# Patient Record
Sex: Female | Born: 1965 | Race: Black or African American | Hispanic: No | Smoking: Former smoker
Health system: Southern US, Community
[De-identification: ages and names within clinical notes are randomized; demographics above are authoritative.]

## PROBLEM LIST (undated history)

## (undated) DIAGNOSIS — F329 Major depressive disorder, single episode, unspecified: Secondary | ICD-10-CM

## (undated) DIAGNOSIS — E559 Vitamin D deficiency, unspecified: Secondary | ICD-10-CM

## (undated) DIAGNOSIS — Z9889 Other specified postprocedural states: Secondary | ICD-10-CM

## (undated) DIAGNOSIS — G43909 Migraine, unspecified, not intractable, without status migrainosus: Secondary | ICD-10-CM

## (undated) DIAGNOSIS — R87810 Cervical high risk human papillomavirus (HPV) DNA test positive: Secondary | ICD-10-CM

## (undated) DIAGNOSIS — F32A Depression, unspecified: Secondary | ICD-10-CM

## (undated) DIAGNOSIS — Z6826 Body mass index (BMI) 26.0-26.9, adult: Secondary | ICD-10-CM

## (undated) DIAGNOSIS — N943 Premenstrual tension syndrome: Secondary | ICD-10-CM

## (undated) DIAGNOSIS — K219 Gastro-esophageal reflux disease without esophagitis: Secondary | ICD-10-CM

## (undated) DIAGNOSIS — R232 Flushing: Secondary | ICD-10-CM

## (undated) DIAGNOSIS — D259 Leiomyoma of uterus, unspecified: Secondary | ICD-10-CM

## (undated) DIAGNOSIS — E785 Hyperlipidemia, unspecified: Secondary | ICD-10-CM

## (undated) DIAGNOSIS — T753XXA Motion sickness, initial encounter: Secondary | ICD-10-CM

## (undated) DIAGNOSIS — N950 Postmenopausal bleeding: Secondary | ICD-10-CM

## (undated) HISTORY — DX: Postmenopausal bleeding: N95.0

## (undated) HISTORY — DX: Depression, unspecified: F32.A

## (undated) HISTORY — DX: Motion sickness, initial encounter: T75.3XXA

## (undated) HISTORY — DX: Other specified postprocedural states: Z98.890

## (undated) HISTORY — PX: UTERINE ARTERY EMBOLIZATION: SHX2629

## (undated) HISTORY — DX: Leiomyoma of uterus, unspecified: D25.9

## (undated) HISTORY — DX: Flushing: R23.2

## (undated) HISTORY — DX: Hyperlipidemia, unspecified: E78.5

## (undated) HISTORY — PX: TUBAL LIGATION: SHX77

## (undated) HISTORY — DX: Premenstrual tension syndrome: N94.3

## (undated) HISTORY — DX: Cervical high risk human papillomavirus (HPV) DNA test positive: R87.810

## (undated) HISTORY — DX: Body mass index (BMI) 26.0-26.9, adult: Z68.26

## (undated) HISTORY — DX: Major depressive disorder, single episode, unspecified: F32.9

## (undated) HISTORY — DX: Gastro-esophageal reflux disease without esophagitis: K21.9

## (undated) HISTORY — DX: Vitamin D deficiency, unspecified: E55.9

## (undated) HISTORY — DX: Migraine, unspecified, not intractable, without status migrainosus: G43.909

## (undated) HISTORY — PX: DILATION AND EVACUATION: SHX1459

---

## 1999-06-30 ENCOUNTER — Other Ambulatory Visit: Admission: RE | Admit: 1999-06-30 | Discharge: 1999-06-30 | Payer: Self-pay | Admitting: Gynecology

## 1999-10-22 ENCOUNTER — Other Ambulatory Visit: Admission: RE | Admit: 1999-10-22 | Discharge: 1999-10-22 | Payer: Self-pay | Admitting: Gynecology

## 2000-11-22 ENCOUNTER — Other Ambulatory Visit: Admission: RE | Admit: 2000-11-22 | Discharge: 2000-11-22 | Payer: Self-pay | Admitting: Gynecology

## 2003-01-16 ENCOUNTER — Emergency Department (HOSPITAL_COMMUNITY): Admission: EM | Admit: 2003-01-16 | Discharge: 2003-01-16 | Payer: Self-pay | Admitting: Emergency Medicine

## 2006-03-09 ENCOUNTER — Encounter: Admission: RE | Admit: 2006-03-09 | Discharge: 2006-03-09 | Payer: Self-pay | Admitting: Obstetrics and Gynecology

## 2006-03-10 ENCOUNTER — Encounter: Admission: RE | Admit: 2006-03-10 | Discharge: 2006-03-10 | Payer: Self-pay | Admitting: Interventional Radiology

## 2006-04-03 ENCOUNTER — Ambulatory Visit (HOSPITAL_COMMUNITY): Admission: RE | Admit: 2006-04-03 | Discharge: 2006-04-03 | Payer: Self-pay | Admitting: Interventional Radiology

## 2006-04-05 ENCOUNTER — Observation Stay (HOSPITAL_COMMUNITY): Admission: RE | Admit: 2006-04-05 | Discharge: 2006-04-06 | Payer: Self-pay | Admitting: Interventional Radiology

## 2006-06-30 ENCOUNTER — Encounter (INDEPENDENT_AMBULATORY_CARE_PROVIDER_SITE_OTHER): Payer: Self-pay | Admitting: Specialist

## 2006-06-30 ENCOUNTER — Ambulatory Visit (HOSPITAL_COMMUNITY): Admission: RE | Admit: 2006-06-30 | Discharge: 2006-06-30 | Payer: Self-pay | Admitting: Gastroenterology

## 2007-04-10 ENCOUNTER — Encounter: Admission: RE | Admit: 2007-04-10 | Discharge: 2007-04-10 | Payer: Self-pay | Admitting: Obstetrics and Gynecology

## 2008-04-11 ENCOUNTER — Encounter: Admission: RE | Admit: 2008-04-11 | Discharge: 2008-04-11 | Payer: Self-pay | Admitting: Obstetrics and Gynecology

## 2009-04-13 ENCOUNTER — Encounter: Admission: RE | Admit: 2009-04-13 | Discharge: 2009-04-13 | Payer: Self-pay | Admitting: Obstetrics and Gynecology

## 2010-04-14 ENCOUNTER — Encounter: Admission: RE | Admit: 2010-04-14 | Discharge: 2010-04-14 | Payer: Self-pay | Admitting: Obstetrics and Gynecology

## 2010-09-26 ENCOUNTER — Encounter: Payer: Self-pay | Admitting: Gastroenterology

## 2011-01-21 NOTE — Op Note (Signed)
NAME:  Sharon Mccall, Sharon Mccall               ACCOUNT NO.:  [id number]   MEDICAL RECORD NO.:  [id number]          PATIENT TYPE:  AMB   LOCATION:  ENDO                         FACILITY:  [facility]   PHYSICIAN:  Anselmo Rod, M.D.  DATE OF BIRTH:  Dec 19, 1965   DATE OF PROCEDURE:  06/30/2006  DATE OF DISCHARGE:  06/30/2006                                 OPERATIVE REPORT   PROCEDURE PERFORMED:  Esophagogastroduodenoscopy with gastric biopsies.   ENDOSCOPIST:  Anselmo Rod, M.D.   INSTRUMENT USED:  Olympus video panendoscope.   INDICATIONS FOR PROCEDURE:  The patient is a 45 year old female undergoing  esophagogastroduodenoscopy.  The patient history or reflux and epigastric  pain.  Rule out peptic ulcer disease, esophagitis, gastritis, etc.   PREPROCEDURE PREPARATION:  Informed consent was procured from the patient.  The patient was fasted for four hours prior to the procedure.  The risks and  benefits of the procedure were discussed with her in great detail.   PREPROCEDURE PHYSICAL:  The patient had stable vital signs.  Neck supple,  chest clear to auscultation.  S1, S2 regular.  Abdomen soft with normal  bowel sounds.   DESCRIPTION OF PROCEDURE:  The patient was placed in the left lateral  decubitus position and sedated with 50 mcg of fentanyl and 5 mg of Versed in  slow incremental doses.  Once the patient was adequately sedated and  maintained on low-flow oxygen and continuous cardiac monitoring, the Olympus  video panendoscope was advanced through the mouth piece over the tongue into  the esophagus under direct vision.  The entire esophagus appeared normal  with no evidence of ring, stricture, masses, esophagitis or Barrett's  mucosa.  The scope was then advanced to the stomach.  Edematous gastric  folds were noted.  Biopsies were done to rule out presence of Helicobacter  pylori by pathology.  No masses or polyps were identified.  Retroflexion in  the high cardia revealed small hiatal  hernia.  The proximal small bowel  appeared normal. There was no outlet obstruction.  The patient tolerated the  procedure well without complications.   IMPRESSION:  1. Normal-appearing esophagus.  2. Edematous gastric fold, biopsy done for Helicobacter pylori.  3. Small hiatal hernia seen on high retroflexion.  4. Normal proximal small bowel.   RECOMMENDATIONS:  1. Await pathology results.  2. Avoid all nonsteroidals including aspirin for now.  3. Proceed with a CT scan of the abdomen and pelvis for further evaluation      of abdominal pain.  4. Outpatient followup in the next two weeks for further recommendations.      Anselmo Rod, M.D.  Electronically Signed     JNM/MEDQ  D:  07/02/2006  T:  07/03/2006  Job:  [id number]   cc:   Maxie Better, M.D.

## 2011-03-14 ENCOUNTER — Other Ambulatory Visit: Payer: Self-pay | Admitting: Obstetrics and Gynecology

## 2011-03-14 DIAGNOSIS — Z1231 Encounter for screening mammogram for malignant neoplasm of breast: Secondary | ICD-10-CM

## 2011-04-18 ENCOUNTER — Ambulatory Visit
Admission: RE | Admit: 2011-04-18 | Discharge: 2011-04-18 | Disposition: A | Discharge status: Home / Self Care | Payer: BC Managed Care – PPO | Source: Ambulatory Visit | Attending: Obstetrics and Gynecology | Admitting: Obstetrics and Gynecology

## 2011-04-18 DIAGNOSIS — Z1231 Encounter for screening mammogram for malignant neoplasm of breast: Secondary | ICD-10-CM

## 2012-03-02 ENCOUNTER — Other Ambulatory Visit: Payer: Self-pay | Admitting: Obstetrics and Gynecology

## 2012-03-02 DIAGNOSIS — Z1231 Encounter for screening mammogram for malignant neoplasm of breast: Secondary | ICD-10-CM

## 2012-04-18 ENCOUNTER — Ambulatory Visit
Admission: RE | Admit: 2012-04-18 | Discharge: 2012-04-18 | Disposition: A | Discharge status: Home / Self Care | Payer: BC Managed Care – PPO | Source: Ambulatory Visit | Attending: Obstetrics and Gynecology | Admitting: Obstetrics and Gynecology

## 2012-04-18 DIAGNOSIS — Z1231 Encounter for screening mammogram for malignant neoplasm of breast: Secondary | ICD-10-CM

## 2012-12-20 ENCOUNTER — Ambulatory Visit: Payer: BC Managed Care – PPO | Admitting: *Deleted

## 2013-03-14 ENCOUNTER — Other Ambulatory Visit: Payer: Self-pay

## 2013-03-14 DIAGNOSIS — Z1231 Encounter for screening mammogram for malignant neoplasm of breast: Secondary | ICD-10-CM

## 2013-04-19 ENCOUNTER — Ambulatory Visit
Admission: RE | Admit: 2013-04-19 | Discharge: 2013-04-19 | Disposition: A | Discharge status: Home / Self Care | Payer: BC Managed Care – PPO | Source: Ambulatory Visit

## 2013-04-19 DIAGNOSIS — Z1231 Encounter for screening mammogram for malignant neoplasm of breast: Secondary | ICD-10-CM

## 2014-03-12 ENCOUNTER — Other Ambulatory Visit: Payer: Self-pay

## 2014-03-12 DIAGNOSIS — Z1231 Encounter for screening mammogram for malignant neoplasm of breast: Secondary | ICD-10-CM

## 2014-04-21 ENCOUNTER — Encounter (INDEPENDENT_AMBULATORY_CARE_PROVIDER_SITE_OTHER): Payer: Self-pay

## 2014-04-21 ENCOUNTER — Ambulatory Visit
Admission: RE | Admit: 2014-04-21 | Discharge: 2014-04-21 | Disposition: A | Discharge status: Home / Self Care | Payer: BC Managed Care – PPO | Source: Ambulatory Visit

## 2014-04-21 DIAGNOSIS — Z1231 Encounter for screening mammogram for malignant neoplasm of breast: Secondary | ICD-10-CM

## 2015-03-17 ENCOUNTER — Other Ambulatory Visit: Payer: Self-pay

## 2015-03-17 DIAGNOSIS — Z9289 Personal history of other medical treatment: Secondary | ICD-10-CM

## 2015-04-27 ENCOUNTER — Ambulatory Visit
Admission: RE | Admit: 2015-04-27 | Discharge: 2015-04-27 | Disposition: A | Discharge status: Home / Self Care | Payer: BC Managed Care – PPO | Source: Ambulatory Visit

## 2015-04-27 DIAGNOSIS — Z9289 Personal history of other medical treatment: Secondary | ICD-10-CM

## 2015-07-08 ENCOUNTER — Encounter (HOSPITAL_COMMUNITY): Payer: Self-pay | Admitting: Emergency Medicine

## 2015-07-08 ENCOUNTER — Emergency Department (HOSPITAL_COMMUNITY)
Admission: EM | Admit: 2015-07-08 | Discharge: 2015-07-08 | Disposition: A | Discharge status: Home / Self Care | Payer: BC Managed Care – PPO | Attending: Emergency Medicine | Admitting: Emergency Medicine

## 2015-07-08 DIAGNOSIS — Y92410 Unspecified street and highway as the place of occurrence of the external cause: Secondary | ICD-10-CM | POA: Diagnosis not present

## 2015-07-08 DIAGNOSIS — M79605 Pain in left leg: Secondary | ICD-10-CM

## 2015-07-08 DIAGNOSIS — Y9389 Activity, other specified: Secondary | ICD-10-CM | POA: Diagnosis not present

## 2015-07-08 DIAGNOSIS — S199XXA Unspecified injury of neck, initial encounter: Secondary | ICD-10-CM | POA: Insufficient documentation

## 2015-07-08 DIAGNOSIS — S8992XA Unspecified injury of left lower leg, initial encounter: Secondary | ICD-10-CM | POA: Diagnosis present

## 2015-07-08 DIAGNOSIS — Y998 Other external cause status: Secondary | ICD-10-CM | POA: Insufficient documentation

## 2015-07-08 DIAGNOSIS — S46812A Strain of other muscles, fascia and tendons at shoulder and upper arm level, left arm, initial encounter: Secondary | ICD-10-CM | POA: Diagnosis not present

## 2015-07-08 MED ORDER — CYCLOBENZAPRINE HCL 10 MG PO TABS: 10.0000 mg | ORAL_TABLET | Freq: Two times a day (BID) | ORAL | Status: DC | PRN

## 2015-07-08 MED ORDER — IBUPROFEN 600 MG PO TABS: 600.0000 mg | ORAL_TABLET | Freq: Four times a day (QID) | ORAL | Status: DC | PRN
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
## 2015-07-08 NOTE — Discharge Instructions (Signed)
Please read attached information. If you experience any new or worsening signs or symptoms please return to the emergency room for evaluation. Please follow-up with your primary care provider or specialist as discussed. Please use medication prescribed only as directed and discontinue taking if you have any concerning signs or symptoms.   °

## 2016-03-02 ENCOUNTER — Other Ambulatory Visit: Payer: Self-pay | Admitting: Obstetrics and Gynecology

## 2016-03-02 DIAGNOSIS — Z1231 Encounter for screening mammogram for malignant neoplasm of breast: Secondary | ICD-10-CM

## 2016-04-27 ENCOUNTER — Ambulatory Visit
Admission: RE | Admit: 2016-04-27 | Discharge: 2016-04-27 | Disposition: A | Discharge status: Home / Self Care | Payer: BC Managed Care – PPO | Source: Ambulatory Visit | Attending: Obstetrics and Gynecology | Admitting: Obstetrics and Gynecology

## 2016-04-27 DIAGNOSIS — Z1231 Encounter for screening mammogram for malignant neoplasm of breast: Secondary | ICD-10-CM

## 2016-10-01 ENCOUNTER — Emergency Department (HOSPITAL_COMMUNITY): Payer: BC Managed Care – PPO

## 2016-10-01 ENCOUNTER — Emergency Department (HOSPITAL_COMMUNITY)
Admission: EM | Admit: 2016-10-01 | Discharge: 2016-10-01 | Disposition: A | Discharge status: Home / Self Care | Payer: BC Managed Care – PPO | Attending: Emergency Medicine | Admitting: Emergency Medicine

## 2016-10-01 ENCOUNTER — Encounter (HOSPITAL_COMMUNITY): Payer: Self-pay | Admitting: Emergency Medicine

## 2016-10-01 DIAGNOSIS — Z79899 Other long term (current) drug therapy: Secondary | ICD-10-CM | POA: Insufficient documentation

## 2016-10-01 DIAGNOSIS — Y999 Unspecified external cause status: Secondary | ICD-10-CM | POA: Diagnosis not present

## 2016-10-01 DIAGNOSIS — R51 Headache: Secondary | ICD-10-CM | POA: Diagnosis not present

## 2016-10-01 DIAGNOSIS — M545 Low back pain: Secondary | ICD-10-CM | POA: Diagnosis present

## 2016-10-01 DIAGNOSIS — M25552 Pain in left hip: Secondary | ICD-10-CM | POA: Diagnosis not present

## 2016-10-01 DIAGNOSIS — Z87891 Personal history of nicotine dependence: Secondary | ICD-10-CM | POA: Diagnosis not present

## 2016-10-01 DIAGNOSIS — Y92410 Unspecified street and highway as the place of occurrence of the external cause: Secondary | ICD-10-CM | POA: Insufficient documentation

## 2016-10-01 DIAGNOSIS — Y939 Activity, unspecified: Secondary | ICD-10-CM | POA: Insufficient documentation

## 2016-10-01 LAB — I-STAT CHEM 8, ED
BUN: 14 mg/dL (ref 6–20)
Calcium, Ion: 1.18 mmol/L (ref 1.15–1.40)
Chloride: 104 mmol/L (ref 101–111)
Creatinine, Ser: 1.2 mg/dL — ABNORMAL HIGH (ref 0.44–1.00)
Glucose, Bld: 124 mg/dL — ABNORMAL HIGH (ref 65–99)
HCT: 39 % (ref 36.0–46.0)
Hemoglobin: 13.3 g/dL (ref 12.0–15.0)
Potassium: 3.7 mmol/L (ref 3.5–5.1)
Sodium: 142 mmol/L (ref 135–145)
TCO2: 29 mmol/L (ref 0–100)

## 2016-10-01 LAB — I-STAT BETA HCG BLOOD, ED (MC, WL, AP ONLY)
I-stat hCG, quantitative: 5.3 m[IU]/mL — ABNORMAL HIGH (ref <5)
I-stat hCG, quantitative: <5 m[IU]/mL (ref <5)

## 2016-10-01 MED ORDER — ONDANSETRON 4 MG PO TBDP: ORAL_TABLET | ORAL | 0 refills | Status: DC

## 2016-10-01 MED ORDER — ONDANSETRON HCL 4 MG/2ML IJ SOLN
4.0000 mg | Freq: Once | INTRAMUSCULAR | Status: AC
  Administered 2016-10-01: 4 mg via INTRAVENOUS
  Filled 2016-10-01: qty 2

## 2016-10-01 MED ORDER — SODIUM CHLORIDE 0.9 % IV BOLUS (SEPSIS)
1000.0000 mL | Freq: Once | INTRAVENOUS | Status: AC
  Administered 2016-10-01: 1000 mL via INTRAVENOUS

## 2016-10-01 MED ORDER — IOPAMIDOL (ISOVUE-300) INJECTION 61%
INTRAVENOUS | Status: AC
  Administered 2016-10-01: 100 mL via INTRAVENOUS
  Filled 2016-10-01: qty 100

## 2016-10-01 MED ORDER — ONDANSETRON 4 MG PO TBDP
4.0000 mg | ORAL_TABLET | Freq: Once | ORAL | Status: AC
  Administered 2016-10-01: 4 mg via ORAL
  Filled 2016-10-01: qty 1

## 2016-10-01 MED ORDER — HYDROMORPHONE HCL 1 MG/ML IJ SOLN
1.0000 mg | Freq: Once | INTRAMUSCULAR | Status: AC
  Administered 2016-10-01: 1 mg via INTRAVENOUS
  Filled 2016-10-01: qty 1

## 2016-10-01 MED ORDER — TRAMADOL HCL 50 MG PO TABS: 50.0000 mg | ORAL_TABLET | Freq: Four times a day (QID) | ORAL | 0 refills | Status: DC | PRN

## 2016-10-01 NOTE — ED Notes (Signed)
Pt returned from CT scan and assisted pt to restroom. Pt instructed on use of call bell in restroom to notify staff when done.

## 2016-10-01 NOTE — ED Notes (Signed)
Pt reports understanding of discharge information. No questions at time of discharge 
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
## 2016-10-01 NOTE — Discharge Instructions (Signed)
Rest at home 1-2 days. Drink plenty of fluids. Take medicines as needed for nausea and pain. Follow-up with your family doctor next week for recheck

## 2016-10-01 NOTE — ED Notes (Signed)
Pt in radiology at this time. 
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
## 2016-11-28 ENCOUNTER — Other Ambulatory Visit: Payer: Self-pay | Admitting: Internal Medicine

## 2016-11-28 ENCOUNTER — Ambulatory Visit
Admission: RE | Admit: 2016-11-28 | Discharge: 2016-11-28 | Disposition: A | Discharge status: Home / Self Care | Payer: BC Managed Care – PPO | Source: Ambulatory Visit | Attending: Internal Medicine | Admitting: Internal Medicine

## 2016-11-28 DIAGNOSIS — M19019 Primary osteoarthritis, unspecified shoulder: Secondary | ICD-10-CM

## 2017-01-12 ENCOUNTER — Encounter: Payer: Self-pay | Admitting: Internal Medicine

## 2017-02-03 ENCOUNTER — Encounter: Payer: Self-pay | Admitting: Internal Medicine

## 2017-02-03 ENCOUNTER — Telehealth: Payer: Self-pay | Admitting: Internal Medicine

## 2017-02-03 ENCOUNTER — Encounter (INDEPENDENT_AMBULATORY_CARE_PROVIDER_SITE_OTHER): Payer: Self-pay

## 2017-02-03 ENCOUNTER — Ambulatory Visit (INDEPENDENT_AMBULATORY_CARE_PROVIDER_SITE_OTHER): Payer: BC Managed Care – PPO | Admitting: Internal Medicine

## 2017-02-03 VITALS — BP 118/84 | HR 76 | Ht 62.0 in | Wt 144.8 lb

## 2017-02-03 DIAGNOSIS — R9431 Abnormal electrocardiogram [ECG] [EKG]: Secondary | ICD-10-CM | POA: Diagnosis not present

## 2017-02-03 DIAGNOSIS — R0602 Shortness of breath: Secondary | ICD-10-CM | POA: Diagnosis not present

## 2017-02-03 DIAGNOSIS — E785 Hyperlipidemia, unspecified: Secondary | ICD-10-CM

## 2017-02-03 MED ORDER — ROSUVASTATIN CALCIUM 10 MG PO TABS: 10.0000 mg | ORAL_TABLET | Freq: Every day | ORAL | 3 refills | Status: DC

## 2017-02-03 NOTE — Telephone Encounter (Signed)
Called pt to inform her that her Rx was already sent to her pharmacy and if she has any other problems, questions or concerns to call our office. Pt verbalized understanding.

## 2017-02-03 NOTE — Patient Instructions (Signed)
Your physician has recommended you make the following change in your medication:  1.) rosuvastatin (Crestor) 10 mg once a day  Your physician recommends that you return for lab work in: 2 months (lipids/liver function)  Your physician has requested that you have an echocardiogram. Echocardiography is a painless test that uses sound waves to create images of your heart. It provides your doctor with information about the size and shape of your heart and how well your heart's chambers and valves are working. This procedure takes approximately one hour. There are no restrictions for this procedure.  Follow up with your physician will depend on test results.

## 2017-02-03 NOTE — Progress Notes (Signed)
Cardiology Office Note   Date:  02/03/2017   ID:  Sharon Mccall, DOB Feb 01, 1966, MRN [id number]  PCP:  Leeroy Cha, MD  Cardiologist:   Dorris Carnes, MD   Pt refree for eval/Rx of abnormal lipid panel   Referred by Dr COusins   History of Present Illness: Sharon Mccall is a 51 y.o. female who was seen at [facility] done  LDL noted to be 199  Talking to pt she  Says she gets SOB at times   Exhausted all the time  She has chalked fatigue  up to menopause Winded going up stairs New compared to when younger  Doesn't go to gym Used walk 6 miles per day   Denies CP   Labs at [facility] LDL was 199    Current Meds  Medication Sig  . Aspirin-Acetaminophen-Caffeine (EXCEDRIN MIGRAINE PO) Take by mouth as needed.   Marland Kitchen CALCIUM-VITAMIN D PO Take by mouth as directed.   Marland Kitchen esomeprazole (NEXIUM) 20 MG capsule Take 20 mg by mouth daily.  . Vitamin D, Ergocalciferol, (DRISDOL) 50000 units CAPS capsule Take 50,000 Units by mouth every 7 (seven) days.     Allergies:   Phenergan [promethazine hcl]   Past Medical History:  Diagnosis Date  . Body mass index (bmi) 26.0-26.9, adult   . Cervical high risk HPV (human papillomavirus) test positive   . Depression   . Elevated fasting lipid profile   . GERD (gastroesophageal reflux disease)   . Hot flashes   . Hx of colonoscopy   . Migraines   . PMB (postmenopausal bleeding)   . PMS (premenstrual syndrome)   . Sea sickness   . Uterine fibroid   . Vitamin D deficiency     Past Surgical History:  Procedure Laterality Date  . DILATION AND EVACUATION    . TUBAL LIGATION    . UTERINE ARTERY EMBOLIZATION       Social History:  The patient  reports that she quit smoking about 23 years ago. She has never used smokeless tobacco. She reports that she does not drink alcohol or use drugs.   Family History:  The patient's family history includes Colon polyps in her father; Diabetes Mellitus I in her father; Hypertension  in her paternal grandmother; Lung cancer in her maternal grandmother; Prostate cancer in her paternal grandfather.   Mother and father with high cholesterol    ROS:  Please see the history of present illness. All other systems are reviewed and  Negative to the above problem except as noted.    PHYSICAL EXAM: VS:  BP 118/84   Pulse 76   Ht 5\' 2"  (1.575 m)   Wt 65.7 kg (144 lb 12.8 oz)   LMP 04/14/2011   BMI 26.48 kg/m   GEN: Well nourished, well developed, in no acute distress  HEENT: normal  Neck: no JVD, carotid bruits, or masses Cardiac: RRR; no murmurs, rubs, or gallops,no edema  Respiratory:  clear to auscultation bilaterally, normal work of breathing GI: soft, nontender, nondistended, + BS  No hepatomegaly  MS: no deformity Moving all extremities   Skin: warm and dry, no rash Neuro:  Strength and sensation are intact Psych: euthymic mood, full affect   EKG:  EKG is ordered today.  SR 76 bpm  LVH with QRS widening  ST changes consistent with repol abnormality, cannot exclude ischemia  No old EKGs to compare     Lipid Panel No results found for:  CHOL, TRIG, HDL, CHOLHDL, VLDL, LDLCALC, LDLDIRECT    Wt Readings from Last 3 Encounters:  02/03/17 65.7 kg (144 lb 12.8 oz)  10/01/16 63.5 kg (140 lb)      ASSESSMENT AND PLAN:  Pt is a 51 yo who was referre for dyslpidemia  Signif elevation in LDL ON exam, pt comfortable  EKG abnormal  No old to compare  I would recomm  1  Start statin Recomm Crestor  F/U liopids in 8 wks  2  Set up for echo to eval LV thickness and function  Further tersting baase on test reuslts     Current medicines are reviewed at length with the patient today.  The patient does not have concerns regarding medicines.  Signed, Dorris Carnes, MD  02/03/2017 9:08 AM    [address], [place], [place]  [address] Phone: [phone]; Fax: [phone]

## 2017-02-03 NOTE — Telephone Encounter (Signed)
New message     *STAT* If patient is at the pharmacy, call can be transferred to refill team.   1. Which medications need to be refilled? (please list name of each medication and dose if known) Crestor   2. Which pharmacy/location (including street and city if local pharmacy) is medication to be sent to?  [facility] on [address]   3. Do they need a 30 day or 90 day supply? 30 to see if it works before she buys a 67 day

## 2017-02-13 ENCOUNTER — Other Ambulatory Visit (HOSPITAL_COMMUNITY): Payer: BC Managed Care – PPO

## 2017-02-15 ENCOUNTER — Ambulatory Visit (HOSPITAL_COMMUNITY): Payer: BC Managed Care – PPO | Attending: Cardiology

## 2017-02-15 ENCOUNTER — Other Ambulatory Visit: Payer: Self-pay

## 2017-02-15 VITALS — BP 144/99

## 2017-02-15 DIAGNOSIS — R0602 Shortness of breath: Secondary | ICD-10-CM

## 2017-02-15 DIAGNOSIS — E785 Hyperlipidemia, unspecified: Secondary | ICD-10-CM | POA: Diagnosis not present

## 2017-02-15 DIAGNOSIS — R9431 Abnormal electrocardiogram [ECG] [EKG]: Secondary | ICD-10-CM | POA: Diagnosis not present

## 2017-02-15 MED ORDER — PERFLUTREN LIPID MICROSPHERE
1.0000 mL | INTRAVENOUS | Status: AC | PRN
Start: 2017-02-15 — End: 2017-02-15
  Administered 2017-02-15: 1 mL via INTRAVENOUS

## 2017-02-16 ENCOUNTER — Other Ambulatory Visit: Payer: Self-pay | Admitting: Obstetrics and Gynecology

## 2017-02-16 DIAGNOSIS — Z1231 Encounter for screening mammogram for malignant neoplasm of breast: Secondary | ICD-10-CM

## 2017-02-17 ENCOUNTER — Telehealth: Payer: Self-pay

## 2017-02-17 DIAGNOSIS — R0602 Shortness of breath: Secondary | ICD-10-CM

## 2017-02-17 DIAGNOSIS — Z01812 Encounter for preprocedural laboratory examination: Secondary | ICD-10-CM

## 2017-02-17 DIAGNOSIS — R931 Abnormal findings on diagnostic imaging of heart and coronary circulation: Secondary | ICD-10-CM

## 2017-02-17 NOTE — Telephone Encounter (Signed)
-----   Message from [name], MD sent at 02/16/2017 11:06 PM EDT ----- I have reviewed echo results with pt   Pumping funciton of heart is severely depressed  REcomm R and L heart cath to define  Described risk/benefits  Pt understands and agrees to proceed. Will have nurse call  Pt will need labs prior

## 2017-02-17 NOTE — Telephone Encounter (Signed)
Scheduled patient 6/20 for R and L heart catheterization with Dr. Angelena Form. Patient will come in Monday, 6/18 for pre-procedure labs and to pick up instruction letter. Instructions reviewed. She will read over letter once she receives it and call prior to procedure if she has further questions. Patient agrees with treatment plan.

## 2017-02-20 ENCOUNTER — Other Ambulatory Visit: Payer: BC Managed Care – PPO | Admitting: *Deleted

## 2017-02-20 ENCOUNTER — Telehealth: Payer: Self-pay

## 2017-02-20 DIAGNOSIS — R0602 Shortness of breath: Secondary | ICD-10-CM

## 2017-02-20 DIAGNOSIS — Z01812 Encounter for preprocedural laboratory examination: Secondary | ICD-10-CM

## 2017-02-20 DIAGNOSIS — R931 Abnormal findings on diagnostic imaging of heart and coronary circulation: Secondary | ICD-10-CM

## 2017-02-20 LAB — CBC WITH DIFFERENTIAL/PLATELET
Basophils Absolute: 0 10*3/uL (ref 0.0–0.2)
Basos: 0 %
EOS (ABSOLUTE): 0.1 10*3/uL (ref 0.0–0.4)
Eos: 2 %
Hematocrit: 35.7 % (ref 34.0–46.6)
Hemoglobin: 11.6 g/dL (ref 11.1–15.9)
Immature Grans (Abs): 0 10*3/uL (ref 0.0–0.1)
Immature Granulocytes: 0 %
Lymphocytes Absolute: 1.8 10*3/uL (ref 0.7–3.1)
Lymphs: 39 %
MCH: 25.1 pg — ABNORMAL LOW (ref 26.6–33.0)
MCHC: 32.5 g/dL (ref 31.5–35.7)
MCV: 77 fL — ABNORMAL LOW (ref 79–97)
Monocytes Absolute: 0.4 10*3/uL (ref 0.1–0.9)
Monocytes: 9 %
Neutrophils Absolute: 2.3 10*3/uL (ref 1.4–7.0)
Neutrophils: 50 %
Platelets: 330 10*3/uL (ref 150–379)
RBC: 4.62 x10E6/uL (ref 3.77–5.28)
RDW: 14.4 % (ref 12.3–15.4)
WBC: 4.6 10*3/uL (ref 3.4–10.8)

## 2017-02-20 LAB — PROTIME-INR
INR: 1 (ref 0.8–1.2)
Prothrombin Time: 11 s (ref 9.1–12.0)

## 2017-02-20 LAB — BASIC METABOLIC PANEL
BUN/Creatinine Ratio: 10 (ref 9–23)
BUN: 10 mg/dL (ref 6–24)
CO2: 22 mmol/L (ref 20–29)
Calcium: 9.7 mg/dL (ref 8.7–10.2)
Chloride: 103 mmol/L (ref 96–106)
Creatinine, Ser: 1 mg/dL (ref 0.57–1.00)
GFR calc Af Amer: 76 mL/min/{1.73_m2} (ref >59)
GFR calc non Af Amer: 66 mL/min/{1.73_m2} (ref >59)
Glucose: 77 mg/dL (ref 65–99)
Potassium: 4.5 mmol/L (ref 3.5–5.2)
Sodium: 141 mmol/L (ref 134–144)

## 2017-02-20 LAB — APTT: aPTT: 26 s (ref 24–33)

## 2017-02-20 NOTE — Telephone Encounter (Signed)
Patient contacted pre-catheterization at [facility] scheduled for: 02/22/2017 @ 1300 Verified arrival time and place:  Pt confirms receipt of letter with instructions today.  She has not opened it yet.   Confirmed AM meds to be taken pre-cath with sip of water:  Pt may take all AM meds.  She does not regularly take an ASA, may need on presention to Short Stay. Notified Pt to read letter and if she had any questions to call this nurse.  Name and # given.  Pt indicates understanding.

## 2017-02-22 ENCOUNTER — Other Ambulatory Visit: Payer: Self-pay

## 2017-02-22 ENCOUNTER — Encounter (HOSPITAL_COMMUNITY)
Admission: RE | Disposition: A | Discharge status: Home / Self Care | Payer: Self-pay | Source: Ambulatory Visit | Attending: Cardiovascular Disease

## 2017-02-22 ENCOUNTER — Ambulatory Visit (HOSPITAL_COMMUNITY)
Admission: RE | Admit: 2017-02-22 | Discharge: 2017-02-22 | Disposition: A | Discharge status: Home / Self Care | Payer: BC Managed Care – PPO | Source: Ambulatory Visit | Attending: Cardiovascular Disease | Admitting: Cardiovascular Disease

## 2017-02-22 DIAGNOSIS — I428 Other cardiomyopathies: Secondary | ICD-10-CM

## 2017-02-22 DIAGNOSIS — Z87891 Personal history of nicotine dependence: Secondary | ICD-10-CM | POA: Insufficient documentation

## 2017-02-22 DIAGNOSIS — K219 Gastro-esophageal reflux disease without esophagitis: Secondary | ICD-10-CM | POA: Diagnosis not present

## 2017-02-22 DIAGNOSIS — E559 Vitamin D deficiency, unspecified: Secondary | ICD-10-CM | POA: Insufficient documentation

## 2017-02-22 DIAGNOSIS — F329 Major depressive disorder, single episode, unspecified: Secondary | ICD-10-CM | POA: Diagnosis not present

## 2017-02-22 DIAGNOSIS — I442 Atrioventricular block, complete: Secondary | ICD-10-CM | POA: Diagnosis not present

## 2017-02-22 HISTORY — PX: RIGHT/LEFT HEART CATH AND CORONARY ANGIOGRAPHY: CATH118266

## 2017-02-22 LAB — POCT I-STAT 3, ART BLOOD GAS (G3+)
Acid-base deficit: 1 mmol/L (ref 0.0–2.0)
Bicarbonate: 24.2 mmol/L (ref 20.0–28.0)
O2 Saturation: 99 %
TCO2: 25 mmol/L (ref 0–100)
pCO2 arterial: 41.6 mmHg (ref 32.0–48.0)
pH, Arterial: 7.372 (ref 7.350–7.450)
pO2, Arterial: 149 mmHg — ABNORMAL HIGH (ref 83.0–108.0)

## 2017-02-22 LAB — POCT I-STAT 3, VENOUS BLOOD GAS (G3P V)
Acid-base deficit: 2 mmol/L (ref 0.0–2.0)
Bicarbonate: 23.3 mmol/L (ref 20.0–28.0)
O2 Saturation: 69 %
TCO2: 24 mmol/L (ref 0–100)
pCO2, Ven: 40.5 mmHg — ABNORMAL LOW (ref 44.0–60.0)
pH, Ven: 7.367 (ref 7.250–7.430)
pO2, Ven: 37 mmHg (ref 32.0–45.0)

## 2017-02-22 LAB — POCT ACTIVATED CLOTTING TIME: Activated Clotting Time: 180 seconds

## 2017-02-22 SURGERY — RIGHT/LEFT HEART CATH AND CORONARY ANGIOGRAPHY
Anesthesia: LOCAL

## 2017-02-22 MED ORDER — HEPARIN SODIUM (PORCINE) 1000 UNIT/ML IJ SOLN
INTRAMUSCULAR | Status: DC | PRN
  Administered 2017-02-22: 3500 [IU] via INTRAVENOUS

## 2017-02-22 MED ORDER — SODIUM CHLORIDE 0.9% FLUSH: 3.0000 mL | INTRAVENOUS | Status: DC | PRN

## 2017-02-22 MED ORDER — SODIUM CHLORIDE 0.9% FLUSH: 3.0000 mL | Freq: Two times a day (BID) | INTRAVENOUS | Status: DC

## 2017-02-22 MED ORDER — HEPARIN SODIUM (PORCINE) 1000 UNIT/ML IJ SOLN
INTRAMUSCULAR | Status: AC
  Filled 2017-02-22: qty 1

## 2017-02-22 MED ORDER — HEPARIN (PORCINE) IN NACL 2-0.9 UNIT/ML-% IJ SOLN
INTRAMUSCULAR | Status: DC | PRN
  Administered 2017-02-22: 15:00:00

## 2017-02-22 MED ORDER — LIDOCAINE HCL 1 % IJ SOLN
INTRAMUSCULAR | Status: AC
  Filled 2017-02-22: qty 20

## 2017-02-22 MED ORDER — ASPIRIN 81 MG PO CHEW: 81.0000 mg | CHEWABLE_TABLET | ORAL | Status: DC

## 2017-02-22 MED ORDER — VERAPAMIL HCL 2.5 MG/ML IV SOLN
INTRAVENOUS | Status: AC
  Filled 2017-02-22: qty 2

## 2017-02-22 MED ORDER — MIDAZOLAM HCL 2 MG/2ML IJ SOLN
INTRAMUSCULAR | Status: AC
  Filled 2017-02-22: qty 2

## 2017-02-22 MED ORDER — HEPARIN (PORCINE) IN NACL 2-0.9 UNIT/ML-% IJ SOLN
INTRAMUSCULAR | Status: AC
  Filled 2017-02-22: qty 1000

## 2017-02-22 MED ORDER — IOPAMIDOL (ISOVUE-370) INJECTION 76%
INTRAVENOUS | Status: DC | PRN
  Administered 2017-02-22: 40 mL via INTRAVENOUS

## 2017-02-22 MED ORDER — SODIUM CHLORIDE 0.9 % IV SOLN: 250.0000 mL | INTRAVENOUS | Status: DC | PRN

## 2017-02-22 MED ORDER — VERAPAMIL HCL 2.5 MG/ML IV SOLN
INTRAVENOUS | Status: DC | PRN
  Administered 2017-02-22: 10 mL via INTRA_ARTERIAL

## 2017-02-22 MED ORDER — SODIUM CHLORIDE 0.9 % IV SOLN
INTRAVENOUS | Status: DC
  Administered 2017-02-22: 12:00:00 via INTRAVENOUS

## 2017-02-22 MED ORDER — FENTANYL CITRATE (PF) 100 MCG/2ML IJ SOLN
INTRAMUSCULAR | Status: AC
Start: 2017-02-22 — End: ?
  Filled 2017-02-22: qty 2

## 2017-02-22 MED ORDER — FENTANYL CITRATE (PF) 100 MCG/2ML IJ SOLN
INTRAMUSCULAR | Status: DC | PRN
  Administered 2017-02-22: 25 ug via INTRAVENOUS
  Administered 2017-02-22: 50 ug via INTRAVENOUS

## 2017-02-22 MED ORDER — IOPAMIDOL (ISOVUE-370) INJECTION 76%
INTRAVENOUS | Status: AC
  Filled 2017-02-22: qty 100

## 2017-02-22 MED ORDER — MIDAZOLAM HCL 2 MG/2ML IJ SOLN
INTRAMUSCULAR | Status: DC | PRN
  Administered 2017-02-22: 2 mg via INTRAVENOUS
  Administered 2017-02-22: 1 mg via INTRAVENOUS

## 2017-02-22 MED ORDER — ASPIRIN 81 MG PO CHEW
CHEWABLE_TABLET | ORAL | Status: AC
  Administered 2017-02-22: 81 mg
  Filled 2017-02-22: qty 1

## 2017-02-22 MED ORDER — SODIUM CHLORIDE 0.9 % IV SOLN: INTRAVENOUS | Status: AC

## 2017-02-22 SURGICAL SUPPLY — 21 items
CABLE ADAPT CONN TEMP 6FT (ADAPTER) ×1 IMPLANT
CATH 5FR JL3.5 JR4 ANG PIG MP (CATHETERS) ×1 IMPLANT
CATH BALLN WEDGE 5F 110CM (CATHETERS) ×1 IMPLANT
CATH EXPO 5F MPA-1 (CATHETERS) ×1 IMPLANT
CATH S G BIP PACING (SET/KITS/TRAYS/PACK) ×1 IMPLANT
CATH SWAN GANZ 7F STRAIGHT (CATHETERS) ×1 IMPLANT
COVER PRB 48X5XTLSCP FOLD TPE (BAG) IMPLANT
COVER PROBE 5X48 (BAG) ×2
DEVICE RAD COMP TR BAND LRG (VASCULAR PRODUCTS) ×1 IMPLANT
GLIDESHEATH SLEND SS 6F .021 (SHEATH) ×1 IMPLANT
GUIDEWIRE .025 260CM (WIRE) ×1 IMPLANT
GUIDEWIRE INQWIRE 1.5J.035X260 (WIRE) IMPLANT
INQWIRE 1.5J .035X260CM (WIRE) ×2
KIT HEART LEFT (KITS) ×2 IMPLANT
PACK CARDIAC CATHETERIZATION (CUSTOM PROCEDURE TRAY) ×2 IMPLANT
SHEATH GLIDE SLENDER 4/5FR (SHEATH) ×1 IMPLANT
SHEATH PINNACLE 7F 10CM (SHEATH) ×1 IMPLANT
TRANSDUCER W/STOPCOCK (MISCELLANEOUS) ×2 IMPLANT
TUBING CIL FLEX 10 FLL-RA (TUBING) ×2 IMPLANT
WIRE EMERALD 3MM-J .025X260CM (WIRE) ×1 IMPLANT
WIRE HI TORQ VERSACORE-J 145CM (WIRE) ×1 IMPLANT

## 2017-02-22 NOTE — H&P (View-Only) (Signed)
Cardiology Office Note   Date:  02/03/2017   ID:  Sharon Mccall, DOB [birth year], MRN [id number]  PCP:  Leeroy Cha, MD  Cardiologist:   Dorris Carnes, MD   Pt refree for eval/Rx of abnormal lipid panel   Referred by Dr COusins   History of Present Illness: Sharon Mccall is a 51 y.o. female who was seen at [facility] done  LDL noted to be 199  Talking to pt she  Says she gets SOB at times   Exhausted all the time  She has chalked fatigue  up to menopause Winded going up stairs New compared to when younger  Doesn't go to gym Used walk 6 miles per day   Denies CP   Labs at [facility] LDL was 199    Current Meds  Medication Sig  . Aspirin-Acetaminophen-Caffeine (EXCEDRIN MIGRAINE PO) Take by mouth as needed.   Marland Kitchen CALCIUM-VITAMIN D PO Take by mouth as directed.   Marland Kitchen esomeprazole (NEXIUM) 20 MG capsule Take 20 mg by mouth daily.  . Vitamin D, Ergocalciferol, (DRISDOL) 50000 units CAPS capsule Take 50,000 Units by mouth every 7 (seven) days.     Allergies:   Phenergan [promethazine hcl]   Past Medical History:  Diagnosis Date  . Body mass index (bmi) 26.0-26.9, adult   . Cervical high risk HPV (human papillomavirus) test positive   . Depression   . Elevated fasting lipid profile   . GERD (gastroesophageal reflux disease)   . Hot flashes   . Hx of colonoscopy   . Migraines   . PMB (postmenopausal bleeding)   . PMS (premenstrual syndrome)   . Sea sickness   . Uterine fibroid   . Vitamin D deficiency     Past Surgical History:  Procedure Laterality Date  . DILATION AND EVACUATION    . TUBAL LIGATION    . UTERINE ARTERY EMBOLIZATION       Social History:  The patient  reports that she quit smoking about 23 years ago. She has never used smokeless tobacco. She reports that she does not drink alcohol or use drugs.   Family History:  The patient's family history includes Colon polyps in her father; Diabetes Mellitus I in her father; Hypertension  in her paternal grandmother; Lung cancer in her maternal grandmother; Prostate cancer in her paternal grandfather.   Mother and father with high cholesterol    ROS:  Please see the history of present illness. All other systems are reviewed and  Negative to the above problem except as noted.    PHYSICAL EXAM: VS:  BP 118/84   Pulse 76   Ht 5\' 2"  (1.575 m)   Wt 65.7 kg (144 lb 12.8 oz)   LMP 04/14/2011   BMI 26.48 kg/m   GEN: Well nourished, well developed, in no acute distress  HEENT: normal  Neck: no JVD, carotid bruits, or masses Cardiac: RRR; no murmurs, rubs, or gallops,no edema  Respiratory:  clear to auscultation bilaterally, normal work of breathing GI: soft, nontender, nondistended, + BS  No hepatomegaly  MS: no deformity Moving all extremities   Skin: warm and dry, no rash Neuro:  Strength and sensation are intact Psych: euthymic mood, full affect   EKG:  EKG is ordered today.  SR 76 bpm  LVH with QRS widening  ST changes consistent with repol abnormality, cannot exclude ischemia  No old EKGs to compare     Lipid Panel No results found for:  CHOL, TRIG, HDL, CHOLHDL, VLDL, LDLCALC, LDLDIRECT    Wt Readings from Last 3 Encounters:  02/03/17 65.7 kg (144 lb 12.8 oz)  10/01/16 63.5 kg (140 lb)      ASSESSMENT AND PLAN:  Pt is a 51 yo who was referre for dyslpidemia  Signif elevation in LDL ON exam, pt comfortable  EKG abnormal  No old to compare  I would recomm  1  Start statin Recomm Crestor  F/U liopids in 8 wks  2  Set up for echo to eval LV thickness and function  Further tersting baase on test reuslts     Current medicines are reviewed at length with the patient today.  The patient does not have concerns regarding medicines.  Signed, Dorris Carnes, MD  02/03/2017 9:08 AM    [address], [place], [place]  [address] Phone: [phone]; Fax: [phone]

## 2017-02-22 NOTE — Interval H&P Note (Signed)
History and Physical Interval Note:  02/22/2017 11:52 AM  Sharon Mccall  has presented today for cardiac cath with the diagnosis of cardiomyopathy, dyspnea. The various methods of treatment have been discussed with the patient and family. After consideration of risks, benefits and other options for treatment, the patient has consented to  Procedure(s): Right/Left Heart Cath and Coronary Angiography (N/A) as a surgical intervention .  The patient's history has been reviewed, patient examined, no change in status, stable for surgery.  I have reviewed the patient's chart and labs.  Questions were answered to the patient's satisfaction.    Cath Lab Visit (complete for each Cath Lab visit)  Clinical Evaluation Leading to the Procedure:   ACS: No.  Non-ACS:    Anginal Classification: CCS II  Anti-ischemic medical therapy: No Therapy  Non-Invasive Test Results: No non-invasive testing performed  Prior CABG: No previous CABG         Lauree Chandler

## 2017-02-22 NOTE — Progress Notes (Signed)
Site area: rt fv sheath Site Prior to Removal:  Level 0 Pressure Applied For: 15 minutes Manual:   yes Patient Status During Pull:  stable Post Pull Site:  Level 0 Post Pull Instructions Given:  yes Post Pull Pulses Present: palpable Dressing Applied:  Gauze and tegaderm Bedrest begins @ 5396 Comments:

## 2017-02-22 NOTE — Progress Notes (Signed)
Site area: rt ac venous sheath Site Prior to Removal:  Level 0 Pressure Applied For:  10 minutes Manual:   yes Patient Status During Pull:  stable Post Pull Site:  Level  0 Post Pull Instructions Given:  yes Post Pull Pulses Present: palpable Dressing Applied:  Gauze and tegaderm Bedrest begins @  Comments:   

## 2017-02-22 NOTE — Discharge Instructions (Signed)

## 2017-02-23 ENCOUNTER — Encounter (HOSPITAL_COMMUNITY): Payer: Self-pay | Admitting: Cardiovascular Disease

## 2017-02-23 ENCOUNTER — Telehealth: Payer: Self-pay | Admitting: Internal Medicine

## 2017-02-23 MED FILL — Lidocaine HCl Local Inj 1%: INTRAMUSCULAR | Qty: 20 | Status: AC

## 2017-02-23 NOTE — Telephone Encounter (Signed)
   Pt called due to low grade fever of 99.0.  She has a cold. Had cath yesterday - sites are clear w/o drainage or erythema. She is asymptomatic.  I reassured her that fever was not likely related to catheterization.  As she is feeling well, there is no need to take an antipyretic, however if fever increases to > 101, she may take tylenol.  I rec that she call back for persistent or worsening fever.  Caller verbalized understanding and was grateful for the call back.  Murray Hodgkins, NP 02/23/2017, 7:09 PM

## 2017-02-23 NOTE — Telephone Encounter (Signed)
New message    Pt would like her results from the test yesterday

## 2017-02-23 NOTE — Telephone Encounter (Signed)
Left message to call back  

## 2017-02-24 ENCOUNTER — Telehealth: Payer: Self-pay | Admitting: Internal Medicine

## 2017-02-24 NOTE — Telephone Encounter (Signed)
Spoke with Pt to set up f/u appt s/p cath.  Scheduled appt with APP for 03/15/2017 @ 830 at [facility].  Pt unable to come between 7/6 and 7/10 d/t out of town.   Pt asked about her cath results.  Discussed Pt cath results, EF, and potential medication treatment.  Pt indicated understanding.

## 2017-02-24 NOTE — Telephone Encounter (Signed)
New message    Pt is calling for results.

## 2017-02-24 NOTE — Telephone Encounter (Signed)
Reviewed with Dr. Harrington Challenger.  Will call patient and try to move her appointment to 03/02/17.

## 2017-02-27 ENCOUNTER — Telehealth: Payer: Self-pay

## 2017-02-27 NOTE — Telephone Encounter (Signed)
VM received from Pt.  Per Pt, she has developed hives on the right side of her body.   Pt with recent catheterization to right wrist and right groin.  States entire right side of her body is covered in hives.  States she has taken 4 doses of benadryl in 24 hours with no improvement.  Requesting a call back.

## 2017-02-27 NOTE — Telephone Encounter (Signed)
Called patient who reports a raised rash, not red, not tender, slight itch.  Not welty.  "looks like eczema".  Right forearm, right thigh and left side of face.  Denies any swelling or difficulty breathing.  I recommended she see her PCP.  She is in the middle of finding a new one so I advised she try a hydrocortisone cream or go to urgent care to have it evaluated.  She had a low grade temp/cold symptoms last week.     As discussed previously with Dr. Harrington Challenger, moved patient's post cath appointment up to this Friday 6/29.

## 2017-02-28 NOTE — Telephone Encounter (Signed)
Agree with recommendations.  

## 2017-03-03 ENCOUNTER — Ambulatory Visit (INDEPENDENT_AMBULATORY_CARE_PROVIDER_SITE_OTHER): Payer: BC Managed Care – PPO | Admitting: Internal Medicine

## 2017-03-03 ENCOUNTER — Telehealth: Payer: Self-pay | Admitting: Internal Medicine

## 2017-03-03 VITALS — BP 114/70 | HR 75 | Ht 62.0 in | Wt 141.0 lb

## 2017-03-03 DIAGNOSIS — E785 Hyperlipidemia, unspecified: Secondary | ICD-10-CM

## 2017-03-03 DIAGNOSIS — I5022 Chronic systolic (congestive) heart failure: Secondary | ICD-10-CM

## 2017-03-03 MED ORDER — SACUBITRIL-VALSARTAN 24-26 MG PO TABS: 1.0000 | ORAL_TABLET | Freq: Two times a day (BID) | ORAL | 0 refills | Status: DC

## 2017-03-03 NOTE — Patient Instructions (Signed)
Medication Instructions:  Please start Entresto 24-26 mg - take one a day for 2 days then increase to one tablet twice a day. Continue all other medications as listed.  Labwork: Will need lab work in 2 weeks when you return for your follow up appt.   Follow-Up: Follow up in 2 weeks with Dr Harrington Challenger.  If you need a refill on your cardiac medications before your next appointment, please call your pharmacy.  Thank you for choosing [facility]!!

## 2017-03-03 NOTE — Telephone Encounter (Signed)
Returned call to patient-patient wondering what to do if she experiences extreme fatigue and/or fluid retention after starting Entresto (started today at [facility] with Dr. Harrington Challenger).  Advised if she starts experiencing any side effects to call our office to make [place] aware and we can discuss with MD.   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   Advised that it can be a side effect but doesn't mean that she will experience this.  Patient agrees and verbalized she will try it and will contact us with any concerns or questions.

## 2017-03-03 NOTE — Progress Notes (Signed)
Cardiology Office Note   Date:  03/03/2017   ID:  Sharon Mccall, DOB 1966/01/29, MRN [id number]  PCP:  Leeroy Cha, MD  Cardiologist:   Dorris Carnes, MD   Pt presents for f/u of chronic systolic CHF   History of Present Illness: Yesennia Hirota Lamba is a 51 y.o. female with a history of SOB  I saw her for the first time on June 1   Echo donw showing severe LV dysfunction   Cat hon 6/20 showed no CAD  Normal filling pressures    Pt complains of itching on R arm and R leg  ? If related to crestor    Current Meds  Medication Sig  . Aspirin-Acetaminophen-Caffeine (EXCEDRIN MIGRAINE PO) Take 1 tablet by mouth daily as needed (headache).   . CALCIUM-VITAMIN D PO Take 1 tablet by mouth daily.   Marland Kitchen esomeprazole (NEXIUM) 20 MG capsule Take 20 mg by mouth daily.  . rosuvastatin (CRESTOR) 10 MG tablet Take 1 tablet (10 mg total) by mouth daily.  . Vitamin D, Ergocalciferol, (DRISDOL) 50000 units CAPS capsule Take 50,000 Units by mouth every 7 (seven) days.     Allergies:   Phenergan [promethazine hcl] and Wellbutrin [bupropion]   Past Medical History:  Diagnosis Date  . Body mass index (bmi) 26.0-26.9, adult   . Cervical high risk HPV (human papillomavirus) test positive   . Depression   . Elevated fasting lipid profile   . GERD (gastroesophageal reflux disease)   . Hot flashes   . Hx of colonoscopy   . Migraines   . PMB (postmenopausal bleeding)   . PMS (premenstrual syndrome)   . Sea sickness   . Uterine fibroid   . Vitamin D deficiency     Past Surgical History:  Procedure Laterality Date  . DILATION AND EVACUATION    . RIGHT/LEFT HEART CATH AND CORONARY ANGIOGRAPHY N/A 02/22/2017   Procedure: Right/Left Heart Cath and Coronary Angiography;  Surgeon: Burnell Blanks, MD;  Location: [facility];  Service: Cardiovascular;  Laterality: N/A;  . TUBAL LIGATION    . UTERINE ARTERY EMBOLIZATION       Social History:  The patient  reports that she quit  smoking about 23 years ago. She has never used smokeless tobacco. She reports that she does not drink alcohol or use drugs.   Family History:  The patient's family history includes Colon polyps in her father; Diabetes Mellitus I in her father; Hypertension in her paternal grandmother; Lung cancer in her maternal grandmother; Prostate cancer in her paternal grandfather.    ROS:  Please see the history of present illness. All other systems are reviewed and  Negative to the above problem except as noted.    PHYSICAL EXAM: VS:  BP 114/70   Pulse 75   Ht 5\' 2"  (1.575 m)   Wt 64 kg (141 lb)   LMP 04/14/2011   BMI 25.79 kg/m   GEN: Well nourished, well developed, in no acute distress  HEENT: normal  Neck: no JVD, carotid bruits, or masses Cardiac: RRR; no murmurs, rubs, or gallops,no edema  Respiratory:  clear to auscultation bilaterally, normal work of breathing GI: soft, nontender, nondistended, + BS  No hepatomegaly  MS: no deformity Moving all extremities   Skin: warm and dry, no rash Neuro:  Strength and sensation are intact Psych: euthymic mood, full affect   EKG:  EKG is not ordered today.   Lipid Panel No results found for: CHOL, TRIG, HDL,  CHOLHDL, VLDL, LDLCALC, LDLDIRECT    Wt Readings from Last 3 Encounters:  03/03/17 64 kg (141 lb)  02/22/17 64.9 kg (143 lb)  02/03/17 65.7 kg (144 lb 12.8 oz)      ASSESSMENT AND PLAN:  1  Chronic systolic CHF   Volume status is good Cath with normal pressures  Normal coronary arteries   I would recomm adding Entresto   F/U in a couple wks  ? If BP will allow coreg initiation  2  HL  Profound  Would keep on Crestor for now  I am not convinced her complaints of rash due to drug   Started recently   Follow   NO CAD but LDL was 199  WIll need to be followded    Current medicines are reviewed at length with the patient today.  The patient does not have concerns regarding medicines.  Signed, Dorris Carnes, MD  03/03/2017 2:36 PM      [address], [place], [place]  [address] Phone: [phone]; Fax: [phone]

## 2017-03-03 NOTE — Telephone Encounter (Signed)
New Message     Pt c/o medication issue:  1. Name of Medication: Entresto  2. How are you currently taking this medication (dosage and times per day)?  Have not started it   3. Are you having a reaction (difficulty breathing--STAT)? n/a 4. What is your medication issue?  Does this medication cause extreme fatigue or swelling?

## 2017-03-14 ENCOUNTER — Ambulatory Visit: Payer: BC Managed Care – PPO | Admitting: Physician Assistant

## 2017-03-14 NOTE — Progress Notes (Signed)
Cardiology Office Note   Date:  03/15/2017   ID:  Sharon Mccall, DOB March 22, 1966, MRN [id number]  PCP:  Servando Salina, MD  Cardiologist:   Dorris Carnes, MD   Pt presents for f/u of chronic systolic CHF   History of Present Illness: Sharon Mccall is a 51 y.o. female with a history of SOB  I saw her for the first time on June 1   Echo donw showing severe LV dysfunction   Cat hon 6/20 showed no CAD  Normal filling pressures    When I saw the pt in clinic a few wks ago I added entresto to regimen The patient denies dizziness  Does not fatigue though she says she has been tired in the past No edema  Breathing is OK   Current Meds  Medication Sig  . Aspirin-Acetaminophen-Caffeine (EXCEDRIN MIGRAINE PO) Take 1 tablet by mouth daily as needed (headache).   . CALCIUM-VITAMIN D PO Take 1 tablet by mouth daily.   Marland Kitchen esomeprazole (NEXIUM) 20 MG capsule Take 20 mg by mouth daily.  . rosuvastatin (CRESTOR) 10 MG tablet Take 1 tablet (10 mg total) by mouth daily.  . sacubitril-valsartan (ENTRESTO) 24-26 MG Take 1 tablet by mouth 2 (two) times daily.  . Vitamin D, Ergocalciferol, (DRISDOL) 50000 units CAPS capsule Take 50,000 Units by mouth every 7 (seven) days.     Allergies:   Phenergan [promethazine hcl] and Wellbutrin [bupropion]   Past Medical History:  Diagnosis Date  . Body mass index (bmi) 26.0-26.9, adult   . Cervical high risk HPV (human papillomavirus) test positive   . Depression   . Elevated fasting lipid profile   . GERD (gastroesophageal reflux disease)   . Hot flashes   . Hx of colonoscopy   . Migraines   . PMB (postmenopausal bleeding)   . PMS (premenstrual syndrome)   . Sea sickness   . Uterine fibroid   . Vitamin D deficiency     Past Surgical History:  Procedure Laterality Date  . DILATION AND EVACUATION    . RIGHT/LEFT HEART CATH AND CORONARY ANGIOGRAPHY N/A 02/22/2017   Procedure: Right/Left Heart Cath and Coronary Angiography;  Surgeon: Burnell Blanks, MD;  Location: [facility];  Service: Cardiovascular;  Laterality: N/A;  . TUBAL LIGATION    . UTERINE ARTERY EMBOLIZATION       Social History:  The patient  reports that she quit smoking about 23 years ago. She has never used smokeless tobacco. She reports that she does not drink alcohol or use drugs.   Family History:  The patient's family history includes Colon polyps in her father; Diabetes Mellitus I in her father; Hypertension in her paternal grandmother; Lung cancer in her maternal grandmother; Prostate cancer in her paternal grandfather.    ROS:  Please see the history of present illness. All other systems are reviewed and  Negative to the above problem except as noted.    PHYSICAL EXAM: VS:  BP 118/86   Pulse 77   Ht 5\' 2"  (1.575 m)   Wt 64.8 kg (142 lb 12.8 oz)   LMP 04/14/2011   SpO2 99%   BMI 26.12 kg/m   GEN: Well nourished, well developed, in no acute distress  HEENT: normal  Neck: no JVD, carotid bruits, or masses Cardiac: RRR; no murmurs, rubs, or gallops,no edema  Respiratory:  clear to auscultation bilaterally, normal work of breathing GI: soft, nontender, nondistended, + BS  No hepatomegaly  MS: no  deformity Moving all extremities   Skin: warm and dry, no rash Neuro:  Strength and sensation are intact Psych: euthymic mood, full affect   EKG:  EKG is not ordered today.   Lipid Panel No results found for: CHOL, TRIG, HDL, CHOLHDL, VLDL, LDLCALC, LDLDIRECT    Wt Readings from Last 3 Encounters:  03/15/17 64.8 kg (142 lb 12.8 oz)  03/03/17 64 kg (141 lb)  02/22/17 64.9 kg (143 lb)      ASSESSMENT AND PLAN:  1  Chronic systolic CHF  Volume status OK  Tolerating entresto  I am not convinced related t ofatigue   WIll add toprol XL 25 mg   Follow up in a few wks  Check BMET today   F/U echo in the fall   I have asked pt to use echart for response  BP  She pays $ 100 copay per visit    2  HL  Profound  Keep on Crestor  Will need  to have lipids follow  Current medicines are reviewed at length with the patient today.  The patient does not have concerns regarding medicines.  Signed, Dorris Carnes, MD  03/15/2017 8:57 PM    [address], [place], [place]  [address] Phone: [phone]; Fax: [phone]

## 2017-03-15 ENCOUNTER — Other Ambulatory Visit: Payer: Self-pay | Admitting: Internal Medicine

## 2017-03-15 ENCOUNTER — Encounter: Payer: Self-pay | Admitting: Internal Medicine

## 2017-03-15 ENCOUNTER — Ambulatory Visit (INDEPENDENT_AMBULATORY_CARE_PROVIDER_SITE_OTHER): Payer: BC Managed Care – PPO | Admitting: Internal Medicine

## 2017-03-15 ENCOUNTER — Ambulatory Visit: Payer: BC Managed Care – PPO | Admitting: Physician Assistant

## 2017-03-15 VITALS — BP 118/86 | HR 77 | Ht 62.0 in | Wt 142.8 lb

## 2017-03-15 DIAGNOSIS — E785 Hyperlipidemia, unspecified: Secondary | ICD-10-CM | POA: Diagnosis not present

## 2017-03-15 DIAGNOSIS — I5022 Chronic systolic (congestive) heart failure: Secondary | ICD-10-CM | POA: Diagnosis not present

## 2017-03-15 LAB — BASIC METABOLIC PANEL
BUN/Creatinine Ratio: 9 (ref 9–23)
BUN: 8 mg/dL (ref 6–24)
CO2: 27 mmol/L (ref 20–29)
Calcium: 9.7 mg/dL (ref 8.7–10.2)
Chloride: 107 mmol/L — ABNORMAL HIGH (ref 96–106)
Creatinine, Ser: 0.9 mg/dL (ref 0.57–1.00)
GFR calc Af Amer: 87 mL/min/{1.73_m2} (ref >59)
GFR calc non Af Amer: 75 mL/min/{1.73_m2} (ref >59)
Glucose: 97 mg/dL (ref 65–99)
Potassium: 4.2 mmol/L (ref 3.5–5.2)
Sodium: 143 mmol/L (ref 134–144)

## 2017-03-15 MED ORDER — METOPROLOL SUCCINATE ER 25 MG PO TB24: 25.0000 mg | ORAL_TABLET | Freq: Every day | ORAL | 6 refills | Status: DC

## 2017-03-15 NOTE — Patient Instructions (Signed)
Your physician has recommended you make the following change in your medication:  1.) start Toprol XL 25 mg (metoprolol succinate)--one tablet by mouth once      a day  Please check your blood pressure daily at home and call or send MyChart message with list of readings in 3-4 weeks.

## 2017-03-22 ENCOUNTER — Telehealth: Payer: Self-pay | Admitting: Internal Medicine

## 2017-03-22 MED ORDER — SACUBITRIL-VALSARTAN 24-26 MG PO TABS: 1.0000 | ORAL_TABLET | Freq: Two times a day (BID) | ORAL | 11 refills | Status: DC

## 2017-03-22 NOTE — Telephone Encounter (Signed)
Pt calling requesting a PA for entresto 24-26 mg tablet. Resent pt's Rx because the one that was sent was on print. I informed the pt that her pharmacy will be contacting us concerning the PA on her medication and if she has any other problems, questions or concerns to call the office. Pt verbalized understanding.

## 2017-03-29 NOTE — Telephone Encounter (Signed)
**Note De-Identified Sharon Mccall Obfuscation** I have done a PA for Entresto through covermymeds. Awaiting response.

## 2017-03-29 NOTE — Telephone Encounter (Signed)
Entresto PA has been approved through [employer]. Approval good from 03/29/17 until 03/29/18.

## 2017-03-30 ENCOUNTER — Telehealth: Payer: Self-pay | Admitting: *Deleted

## 2017-03-30 NOTE — Telephone Encounter (Signed)
Prior authorization for ENTRESTO approved through [employer]

## 2017-03-31 ENCOUNTER — Telehealth: Payer: Self-pay | Admitting: Internal Medicine

## 2017-03-31 NOTE — Telephone Encounter (Signed)
New message    Pt is calling about her BP. She said she is supposed to take her BP, but the cuff is kind of expensive and she wants to know if the finger tip device will be ok?

## 2017-03-31 NOTE — Telephone Encounter (Signed)
Advised patient to go to her pharmacy or other stores where there are free standing automatic blood pressure cuffs and take her BP there.    She will do this a couple times per week.  Advised to rest for short period prior to taking BP.  She will MyChart or call with readings.

## 2017-04-10 ENCOUNTER — Other Ambulatory Visit: Payer: BC Managed Care – PPO | Admitting: *Deleted

## 2017-04-10 DIAGNOSIS — R0602 Shortness of breath: Secondary | ICD-10-CM

## 2017-04-10 DIAGNOSIS — E785 Hyperlipidemia, unspecified: Secondary | ICD-10-CM

## 2017-04-10 DIAGNOSIS — R9431 Abnormal electrocardiogram [ECG] [EKG]: Secondary | ICD-10-CM

## 2017-04-10 LAB — HEPATIC FUNCTION PANEL
ALT: 15 IU/L (ref 0–32)
AST: 18 IU/L (ref 0–40)
Albumin: 4.4 g/dL (ref 3.5–5.5)
Alkaline Phosphatase: 109 IU/L (ref 39–117)
Bilirubin Total: 0.3 mg/dL (ref 0.0–1.2)
Bilirubin, Direct: 0.11 mg/dL (ref 0.00–0.40)
Total Protein: 7.4 g/dL (ref 6.0–8.5)

## 2017-04-10 LAB — LIPID PANEL
Chol/HDL Ratio: 3.5 ratio (ref 0.0–4.4)
Cholesterol, Total: 156 mg/dL (ref 100–199)
HDL: 44 mg/dL (ref >39)
LDL Calculated: 88 mg/dL (ref 0–99)
Triglycerides: 118 mg/dL (ref 0–149)
VLDL Cholesterol Cal: 24 mg/dL (ref 5–40)

## 2017-04-12 ENCOUNTER — Encounter: Payer: Self-pay | Admitting: *Deleted

## 2017-04-28 ENCOUNTER — Ambulatory Visit
Admission: RE | Admit: 2017-04-28 | Discharge: 2017-04-28 | Disposition: A | Discharge status: Home / Self Care | Payer: BC Managed Care – PPO | Source: Ambulatory Visit | Attending: Obstetrics and Gynecology | Admitting: Obstetrics and Gynecology

## 2017-04-28 DIAGNOSIS — Z1231 Encounter for screening mammogram for malignant neoplasm of breast: Secondary | ICD-10-CM

## 2017-06-20 ENCOUNTER — Telehealth: Payer: Self-pay | Admitting: Internal Medicine

## 2017-06-20 DIAGNOSIS — I428 Other cardiomyopathies: Secondary | ICD-10-CM

## 2017-06-20 NOTE — Telephone Encounter (Signed)
Rhaelyn is calling because she wanted to know if it recommended that she gets a Flu shot and have a record of her blood Pressure . Please call

## 2017-06-20 NOTE — Telephone Encounter (Signed)
Advised patient to get flu shot at any pharmacy. BP readings were from July--highest was 124/85, lowest was 108/81.  She is feeling okay. Wants to know when she should be seen again.  According to last OV note, echo should be repeated in the fall.   Pt aware I am routing to Dr. Harrington Challenger and if echo planned we will call her to schedule.

## 2017-06-22 NOTE — Telephone Encounter (Signed)
She should have a repeat echo this fall  I would schedule for late November/Dec.  Need to reassess LV function.   Keep on meds

## 2017-06-27 NOTE — Addendum Note (Signed)
Addended by: Rodman Key on: 06/27/2017 02:40 PM   Modules accepted: Orders

## 2017-06-27 NOTE — Telephone Encounter (Signed)
Advised patient [facility] will call patient to schedule echo for December.  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

## 2017-09-01 ENCOUNTER — Ambulatory Visit (HOSPITAL_COMMUNITY): Payer: BC Managed Care – PPO | Attending: Internal Medicine

## 2017-09-01 ENCOUNTER — Other Ambulatory Visit: Payer: Self-pay

## 2017-09-01 DIAGNOSIS — I080 Rheumatic disorders of both mitral and aortic valves: Secondary | ICD-10-CM | POA: Insufficient documentation

## 2017-09-01 DIAGNOSIS — I428 Other cardiomyopathies: Secondary | ICD-10-CM | POA: Insufficient documentation

## 2017-09-07 ENCOUNTER — Encounter: Payer: Self-pay | Admitting: *Deleted

## 2017-11-03 ENCOUNTER — Other Ambulatory Visit: Payer: Self-pay | Admitting: *Deleted

## 2017-11-03 MED ORDER — METOPROLOL SUCCINATE ER 25 MG PO TB24: 25.0000 mg | ORAL_TABLET | Freq: Every day | ORAL | 3 refills | Status: DC

## 2018-03-06 ENCOUNTER — Telehealth: Payer: Self-pay | Admitting: Internal Medicine

## 2018-03-06 MED ORDER — METOPROLOL SUCCINATE ER 25 MG PO TB24: 25.0000 mg | ORAL_TABLET | Freq: Every day | ORAL | 1 refills | Status: DC

## 2018-03-06 MED ORDER — ROSUVASTATIN CALCIUM 10 MG PO TABS: 10.0000 mg | ORAL_TABLET | Freq: Every day | ORAL | 1 refills | Status: DC

## 2018-03-06 MED ORDER — SACUBITRIL-VALSARTAN 24-26 MG PO TABS: 1.0000 | ORAL_TABLET | Freq: Two times a day (BID) | ORAL | 1 refills | Status: DC

## 2018-03-06 NOTE — Telephone Encounter (Signed)
Pt's medication was sent to pt's pharmacy as requested. Confirmation received.  °

## 2018-03-06 NOTE — Telephone Encounter (Signed)
New message    *STAT* If patient is at the pharmacy, call can be transferred to refill team.   1. Which medications need to be refilled? (please list name of each medication and dose if known) metoprolol succinate (TOPROL XL) 25 MG 24 hr tablet, rosuvastatin (CRESTOR) 10 MG tablet, sacubitril-valsartan (ENTRESTO) 24-26 MG 2. Which pharmacy/location (including street and city if local pharmacy) is medication to be sent to? [facility] ([place]),  - [address]  3. Do they need a 30 day or 90 day supply? [place]

## 2018-03-12 ENCOUNTER — Other Ambulatory Visit: Payer: Self-pay | Admitting: Obstetrics and Gynecology

## 2018-03-12 DIAGNOSIS — Z1231 Encounter for screening mammogram for malignant neoplasm of breast: Secondary | ICD-10-CM

## 2018-03-15 ENCOUNTER — Telehealth: Payer: Self-pay | Admitting: Internal Medicine

## 2018-03-15 NOTE — Telephone Encounter (Signed)
New Message     *STAT* If patient is at the pharmacy, call can be transferred to refill team.   1. Which medications need to be refilled? (please list name of each medication and dose if known) metoprolol succinate (TOPROL XL) 25 MG 24 hr tablet and sacubitril-valsartan (ENTRESTO) 24-26 MG  2. Which pharmacy/location (including street and city if local pharmacy) is medication to be sent to? [facility] ([place]), [place] [address]  3. Do they need a 30 day or 90 day supply? [place]

## 2018-03-15 NOTE — Telephone Encounter (Signed)
These were sent in on 03/06/18 with enough to last her until her upcoming appointment. I spoke with the patient and made her aware of this. She has not contacted the pharmacy to request the refills but she will call them now.

## 2018-03-26 ENCOUNTER — Telehealth: Payer: Self-pay

## 2018-03-26 NOTE — Telephone Encounter (Signed)
**Note De-Identified Neldon Shepard Obfuscation** I have reviewed the pts chart and we did do an [place] PA last year in July so I called [facility] and did a PA over the phone with [employer].  Per [employer] this PA has been approved until 03/27/19. OI#[id number]  I called the pts pharmacy, [facility], and s/w Katrina. Katrina is aware that this PA has been approved.

## 2018-03-26 NOTE — Telephone Encounter (Signed)
I attempted to do an Entresto PA X 2 through covermymeds.  The first was on 03/19/18 and the second was today.  I received the following message both times:  "[facility] has indicated that it is too soon to refill this medication at the pharmacy for your patient.   I printed the message and have faxed it to the pts pharmacy.

## 2018-04-10 ENCOUNTER — Encounter: Payer: Self-pay | Admitting: Physician Assistant

## 2018-04-10 ENCOUNTER — Encounter: Payer: Self-pay | Admitting: *Deleted

## 2018-04-10 ENCOUNTER — Encounter

## 2018-04-10 ENCOUNTER — Ambulatory Visit (INDEPENDENT_AMBULATORY_CARE_PROVIDER_SITE_OTHER): Payer: BC Managed Care – PPO | Admitting: Physician Assistant

## 2018-04-10 VITALS — BP 120/80 | HR 64 | Ht 62.0 in | Wt 152.8 lb

## 2018-04-10 DIAGNOSIS — R0683 Snoring: Secondary | ICD-10-CM | POA: Diagnosis not present

## 2018-04-10 DIAGNOSIS — I5042 Chronic combined systolic (congestive) and diastolic (congestive) heart failure: Secondary | ICD-10-CM

## 2018-04-10 DIAGNOSIS — E785 Hyperlipidemia, unspecified: Secondary | ICD-10-CM | POA: Diagnosis not present

## 2018-04-10 DIAGNOSIS — R5383 Other fatigue: Secondary | ICD-10-CM | POA: Diagnosis not present

## 2018-04-10 LAB — CBC
Hematocrit: 35.7 % (ref 34.0–46.6)
Hemoglobin: 12.1 g/dL (ref 11.1–15.9)
MCH: 27.1 pg (ref 26.6–33.0)
MCHC: 33.9 g/dL (ref 31.5–35.7)
MCV: 80 fL (ref 79–97)
Platelets: 290 10*3/uL (ref 150–450)
RBC: 4.46 x10E6/uL (ref 3.77–5.28)
RDW: 14.2 % (ref 12.3–15.4)
WBC: 5 10*3/uL (ref 3.4–10.8)

## 2018-04-10 LAB — BASIC METABOLIC PANEL
BUN/Creatinine Ratio: 14 (ref 9–23)
BUN: 14 mg/dL (ref 6–24)
CO2: 21 mmol/L (ref 20–29)
Calcium: 9.6 mg/dL (ref 8.7–10.2)
Chloride: 104 mmol/L (ref 96–106)
Creatinine, Ser: 1.01 mg/dL — ABNORMAL HIGH (ref 0.57–1.00)
GFR calc Af Amer: 74 mL/min/{1.73_m2} (ref >59)
GFR calc non Af Amer: 65 mL/min/{1.73_m2} (ref >59)
Glucose: 92 mg/dL (ref 65–99)
Potassium: 4.9 mmol/L (ref 3.5–5.2)
Sodium: 139 mmol/L (ref 134–144)

## 2018-04-10 LAB — TSH: TSH: 1.54 u[IU]/mL (ref 0.450–4.500)

## 2018-04-10 LAB — PRO B NATRIURETIC PEPTIDE: NT-Pro BNP: 95 pg/mL (ref 0–249)

## 2018-04-10 MED ORDER — CARVEDILOL 3.125 MG PO TABS: 3.1250 mg | ORAL_TABLET | Freq: Two times a day (BID) | ORAL | 3 refills | Status: DC

## 2018-04-10 NOTE — Progress Notes (Signed)
Cardiology Office Note:    Date:  04/10/2018   ID:  Sharon Mccall, DOB March 03, 1966, MRN [id number]  PCP:  Servando Salina, MD  Cardiologist:  Dorris Carnes, MD   Referring MD: Servando Salina, MD   Chief Complaint  Patient presents with  . Follow-up    CHF    History of Present Illness:    Sharon Mccall is a 52 y.o. female with systolic heart failure secondary to nonischemic cardiomyopathy.  EF was initially 25-30% in June 2018.  This improved to 35-40% by echocardiogram in December 2018.  Cardiac catheterization in June 2018 demonstrated no evidence of CAD.  She was last seen by Dr. Harrington Challenger in July 2018.     Ms. Sharon Mccall returns for follow-up.  She is here alone.  She is an Building control surveyor at [employer].  She starts back to school in a couple of weeks.  She notes significant issues with fatigue.  This has been a symptom for her since she started on the medications for heart failure.  She feels like her fatigue is getting worse.  She feels like she does not get enough sleep even though she sleeps through the night.  She does have a history of snoring.  She notes dyspnea with exertion.  This typically occurs with moderate or extreme activities.  She sleeps on an incline due to acid reflux.  She denies paroxysmal nocturnal dyspnea.  She denies any lower extremity swelling or abdominal distention.  She denies syncope.  She has occasional, atypical chest discomfort.  She denies exertional symptoms.  Prior CV studies:   The following studies were reviewed today:  Echo 09/01/2017 EF 35-40, diffuse hypokinesis, grade 1 diastolic dysfunction, aortic sclerosis without stenosis, trivial AI, mild MR, mildly reduced RVSF, trivial TR, PASP 20  Right and left cardiac catheterization 02/22/2017 1. No angiographic evidence of CAD 2. Normal filling pressures 3. Non-ischemic cardiomyopathy  Echo 02/15/2017 EF 25-30, diffuse HK, grade 1 diastolic dysfunction, mild AI, mild MR, mild  LAE  Past Medical History:  Diagnosis Date  . Body mass index (bmi) 26.0-26.9, adult   . Cervical high risk HPV (human papillomavirus) test positive   . Depression   . Elevated fasting lipid profile   . GERD (gastroesophageal reflux disease)   . Hot flashes   . Hx of colonoscopy   . Migraines   . PMB (postmenopausal bleeding)   . PMS (premenstrual syndrome)   . Sea sickness   . Uterine fibroid   . Vitamin D deficiency    Surgical Hx: The patient  has a past surgical history that includes Tubal ligation; Dilation and evacuation; Uterine artery embolization; and RIGHT/LEFT HEART CATH AND CORONARY ANGIOGRAPHY (N/A, 02/22/2017).   Current Medications: Current Meds  Medication Sig  . Aspirin-Acetaminophen-Caffeine (EXCEDRIN MIGRAINE PO) Take 1 tablet by mouth daily as needed (headache).   . CALCIUM-VITAMIN D PO Take 1 tablet by mouth daily.   Marland Kitchen esomeprazole (NEXIUM) 20 MG capsule Take 20 mg by mouth daily.  . metoprolol succinate (TOPROL XL) 25 MG 24 hr tablet Take 1 tablet (25 mg total) by mouth daily. Please keep upcoming appt for future refills. Thank you  . rosuvastatin (CRESTOR) 10 MG tablet Take 1 tablet (10 mg total) by mouth daily. Please keep upcoming appt for future refills. Thank you  . sacubitril-valsartan (ENTRESTO) 24-26 MG Take 1 tablet by mouth 2 (two) times daily. Please keep upcoming appt for future refills. Thank you  . Vitamin D, Ergocalciferol, (DRISDOL) 50000  units CAPS capsule Take 50,000 Units by mouth every 7 (seven) days.     Allergies:   Phenergan [promethazine hcl] and Wellbutrin [bupropion]   Social History   Tobacco Use  . Smoking status: Former Smoker    Last attempt to quit: 01/12/1994    Years since quitting: 24.2  . Smokeless tobacco: Never Used  Substance Use Topics  . Alcohol use: No  . Drug use: No     Family Hx: The patient's family history includes Colon polyps in her father; Diabetes Mellitus I in her father; Hypertension in her  paternal grandmother; Lung cancer in her maternal grandmother; Prostate cancer in her paternal grandfather. There is no history of Colon cancer, Ovarian cancer, Breast cancer, or Thyroid disease.  ROS:   Please see the history of present illness.    Review of Systems  Constitution: Positive for decreased appetite, diaphoresis and malaise/fatigue.  Cardiovascular: Positive for chest pain, dyspnea on exertion and irregular heartbeat.  Respiratory: Positive for snoring.   Neurological: Positive for headaches.   All other systems reviewed and are negative.   EKGs/Labs/Other Test Reviewed:    EKG:  EKG is  ordered today.  The ekg ordered today demonstrates normal sinus rhythm, heart rate 64, interventricular conduction delay, QTC 474, similar to prior tracings  Recent Labs: No results found for requested labs within last 8760 hours.   Recent Lipid Panel Lab Results  Component Value Date/Time   CHOL 156 04/10/2017 10:29 AM   TRIG 118 04/10/2017 10:29 AM   HDL 44 04/10/2017 10:29 AM   CHOLHDL 3.5 04/10/2017 10:29 AM   LDLCALC 88 04/10/2017 10:29 AM    Physical Exam:    VS:  BP 120/80   Pulse 64   Ht 5\' 2"  (1.575 m)   Wt 152 lb 12.8 oz (69.3 kg)   LMP 04/14/2011   SpO2 99%   BMI 27.95 kg/m     Wt Readings from Last 3 Encounters:  04/10/18 152 lb 12.8 oz (69.3 kg)  03/15/17 142 lb 12.8 oz (64.8 kg)  03/03/17 141 lb (64 kg)     Physical Exam  Constitutional: She is oriented to [place], place, and time. She appears well-developed and well-nourished. No distress.  HENT:  Head: Normocephalic and atraumatic.  Eyes: No scleral icterus.  Neck: No JVD present.  Cardiovascular: Normal rate and regular rhythm.  No murmur heard. Pulmonary/Chest: Effort normal. She has no rales.  Abdominal: Soft. There is no tenderness.  Musculoskeletal: She exhibits no edema.  Neurological: She is alert and oriented to [place], place, and time.  Skin: Skin is warm and dry.  Psychiatric: She has a  normal mood and affect.    ASSESSMENT & PLAN:    Chronic combined systolic and diastolic heart failure (HCC) EF 35-40 with mild diastolic dysfunction by echocardiogram December 2018.  She is NYHA 2.  She mainly notes significant fatigue.  She feels that this has been a symptoms since starting on medication for heart failure.  She feels that her symptoms are worsening.  She also notes a history of snoring.  I suspect her symptoms are either due to beta-blocker therapy versus obstructive sleep apnea.  I have suggested that we try her on a different beta-blocker to see if this makes a difference.  She does not appear to be volume overloaded.  However, I will check a BNP.  If this is significantly elevated, consider repeating her echocardiogram and starting on diuretic therapy.  QRS 144 ms.  If symptoms  progress and EF drops < 35, question if she would be a candidate for CRT.  Will keep this in mind for now.    -DC metoprolol succinate  -Start carvedilol 3.125 mg twice daily  -BMET, CBC, TSH, NT-proBNP  Dyslipidemia Continue statin therapy.  Other fatigue As noted, I suspect her symptoms may be related to beta-blocker therapy versus sleep apnea.  I will change her metoprolol succinate to carvedilol.  If she cannot tolerate this, she will need to resume metoprolol succinate.  I will also arrange split-night sleep study and obtain labs today as noted above.  Snoring She notes significant fatigue as well as significant snoring history.  Arrange split-night sleep study.   Dispo:  Return in about 4 months (around 08/10/2018) for Routine Follow Up, w/ Dr. Harrington Challenger, or Richardson Dopp, PA-C.   Medication Adjustments/Labs and Tests Ordered: Current medicines are reviewed at length with the patient today.  Concerns regarding medicines are outlined above.  Tests Ordered: Orders Placed This Encounter  Procedures  . Pro b natriuretic peptide  . Basic Metabolic Panel (BMET)  . CBC  . TSH  . EKG 12-Lead  .  Split night study   Medication Changes: Meds ordered this encounter  Medications  . DISCONTD: carvedilol (COREG) 3.125 MG tablet    Sig: Take 1 tablet (3.125 mg total) by mouth 2 (two) times daily.    Dispense:  180 tablet    Refill:  3    CHANGE IN THERAPY; D/C TOPROL  . carvedilol (COREG) 3.125 MG tablet    Sig: Take 1 tablet (3.125 mg total) by mouth 2 (two) times daily.    Dispense:  60 tablet    Refill:  3    CHANGE IN THERAPY; D/C TOPROL    Signed, Richardson Dopp, PA-C  04/10/2018 1:26 PM    [facility] [place], [place], [place]  [address] Phone: [phone]; Fax: [phone]

## 2018-04-10 NOTE — Patient Instructions (Signed)
Medication Instructions:  1. STOP TOPROL   2. START COREG 3.125 MG TWICE DAILY; RX HAS BEEN SENT IN  Labwork: TODAY BMET, CBC, PRO BNMP, TSH  Testing/Procedures: Your physician has recommended that you have a SPLIT NIGHT sleep study. This test records several body functions during sleep, including: brain activity, eye movement, oxygen and carbon dioxide blood levels, heart rate and rhythm, breathing rate and rhythm, the flow of air through your mouth and nose, snoring, body muscle movements, and chest and belly movement.    Follow-Up: DR. Harrington Challenger OR [facility] 3-4 MONTHS  Any Other Special Instructions Will Be Listed Below (If Applicable).     If you need a refill on your cardiac medications before your next appointment, please call your pharmacy.

## 2018-04-11 ENCOUNTER — Telehealth: Payer: Self-pay | Admitting: *Deleted

## 2018-04-11 NOTE — Telephone Encounter (Signed)
-----   Message from Liliane Shi, [place] sent at 04/10/2018 10:31 PM EDT ----- All values are normal or within acceptable limits.   Medication changes / Follow up labs / Other changes or recommendations:   - Continue current medications and follow up as planned.  Richardson Dopp, PA-C 04/10/2018 10:30 PM

## 2018-04-11 NOTE — Telephone Encounter (Signed)
DPR ok to lmom on phone. Lab work good. Continue with current Tx plan. Results have been sent to [facility] as well. Any questions please call [phone].

## 2018-04-12 ENCOUNTER — Telehealth: Payer: Self-pay | Admitting: *Deleted

## 2018-04-12 NOTE — Telephone Encounter (Signed)
Staff message sent to Gae Bon per Lucita Ferrara with [employer] on 04/12/18 no PA required. Ok to schedule sleep study.

## 2018-04-12 NOTE — Telephone Encounter (Signed)
-----   Message from Freada Bergeron, [place] sent at 04/10/2018  3:49 PM EDT ----- Regarding: pre cert   ----- Message ----- From: Michae Kava, CMA Sent: 04/10/2018  11:43 AM To: Freada Bergeron, CMA Subject: FW: SPLIT NIGHT SLEEP                            ----- Message ----- From: Michae Kava, CMA Sent: 04/10/2018  11:42 AM To: [facility] Div Sleep Studies Subject: SPLIT NIGHT SLEEP                              Hi ladies,  Pt saw Brynda Rim. PA today. Pt needs a Split Night Sleep Study, dx snoring, fatigue. Please call pt with date and time.   Thank you Arbie Cookey

## 2018-04-18 ENCOUNTER — Telehealth: Payer: Self-pay | Admitting: *Deleted

## 2018-04-18 NOTE — Telephone Encounter (Signed)
-----   Message from Lauralee Evener, [place] sent at 04/12/2018 11:58 AM EDT ----- Regarding: RE: pre cert Per [employer] no PA is required. Plan does not participate in AIM. Ok to schedule sleep study. ----- Message ----- From: Freada Bergeron, CMA Sent: 04/10/2018   3:49 PM EDT To: [facility] Sleep Studies Subject: pre cert                                         ----- Message ----- From: Michae Kava, CMA Sent: 04/10/2018  11:43 AM To: Freada Bergeron, CMA Subject: FW: SPLIT NIGHT SLEEP                            ----- Message ----- From: Michae Kava, CMA Sent: 04/10/2018  11:42 AM To: [facility] Div Sleep Studies Subject: SPLIT NIGHT SLEEP                              Hi ladies,  Pt saw Brynda Rim. PA today. Pt needs a Split Night Sleep Study, dx snoring, fatigue. Please call pt with date and time.   Thank you Arbie Cookey

## 2018-04-18 NOTE — Telephone Encounter (Signed)
Patient is scheduled for lab study on 05/12/18. Patient understands her sleep study will be done at [facility] sleep lab. Patient understands she will receive a sleep packet in a week or so. Patient understands to call if she does not receive the sleep packet in a timely manner. Patient agrees with treatment and thanked me for call.

## 2018-04-30 ENCOUNTER — Ambulatory Visit
Admission: RE | Admit: 2018-04-30 | Discharge: 2018-04-30 | Disposition: A | Discharge status: Home / Self Care | Payer: BC Managed Care – PPO | Source: Ambulatory Visit | Attending: Obstetrics and Gynecology | Admitting: Obstetrics and Gynecology

## 2018-04-30 ENCOUNTER — Ambulatory Visit: Payer: BC Managed Care – PPO

## 2018-04-30 DIAGNOSIS — Z1231 Encounter for screening mammogram for malignant neoplasm of breast: Secondary | ICD-10-CM

## 2018-05-12 ENCOUNTER — Encounter (HOSPITAL_BASED_OUTPATIENT_CLINIC_OR_DEPARTMENT_OTHER): Payer: BC Managed Care – PPO

## 2018-06-17 ENCOUNTER — Other Ambulatory Visit: Payer: Self-pay | Admitting: Internal Medicine

## 2018-07-09 ENCOUNTER — Telehealth: Payer: Self-pay | Admitting: Internal Medicine

## 2018-07-09 NOTE — Telephone Encounter (Signed)
Patient thinks caught a virus, over the weekend, had vomiting, diarrhea, nausea.  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  Said this morning had a hard time breathing but this has improved.  Was out of breath more than usual.  Denies any swelling.  I adv may be dehydrated and to increase fluids over the next 1-2 days, and adv to try V8 for electrolytes-one small can each day. Adv since SOB has improved since this am - that it should continue to improve and if it gets worse to call back that she may need to be seen urgently.  She has f/u scheduled with Dr. Harrington Challenger next Monday 07/16/18.

## 2018-07-09 NOTE — Telephone Encounter (Signed)
New Message:   Patient would like to speak with some about her heart. Patient stated she had a virus this weekend and it is little hard to breath.

## 2018-07-16 ENCOUNTER — Encounter: Payer: Self-pay | Admitting: Internal Medicine

## 2018-07-16 ENCOUNTER — Ambulatory Visit (INDEPENDENT_AMBULATORY_CARE_PROVIDER_SITE_OTHER): Payer: BC Managed Care – PPO | Admitting: Internal Medicine

## 2018-07-16 VITALS — BP 110/74 | HR 74 | Ht 62.0 in | Wt 150.2 lb

## 2018-07-16 DIAGNOSIS — I5042 Chronic combined systolic (congestive) and diastolic (congestive) heart failure: Secondary | ICD-10-CM | POA: Diagnosis not present

## 2018-07-16 DIAGNOSIS — E782 Mixed hyperlipidemia: Secondary | ICD-10-CM | POA: Diagnosis not present

## 2018-07-16 NOTE — Patient Instructions (Signed)
Medication Instructions:  Your physician recommends that you continue on your current medications as directed. Please refer to the Current Medication list given to you today.  If you need a refill on your cardiac medications before your next appointment, please call your pharmacy.   Lab work: none If you have labs (blood work) drawn today and your tests are completely normal, you will receive your results only by: Marland Kitchen MyChart Message (if you have MyChart) OR . A paper copy in the mail If you have any lab test that is abnormal or we need to change your treatment, we will call you to review the results.  Testing/Procedures: none  Follow-Up: At [facility], you and your health needs are our priority.  As part of our continuing mission to provide you with exceptional heart care, we have created designated Provider Care Teams.  These Care Teams include your primary Cardiologist (physician) and Advanced Practice Providers (APPs -  Physician Assistants and Nurse Practitioners) who all work together to provide you with the care you need, when you need it. You will need a follow up appointment in:  5 months. (around the end of April)  Please call our office 2 months in advance to schedule this appointment.  You may see Dorris Carnes, MD or one of the following Advanced Practice Providers on your designated Care Team: Richardson Dopp, PA-C [place], [place] . Daune Perch, NP  Any Other Special Instructions Will Be Listed Below (If Applicable).

## 2018-07-16 NOTE — Progress Notes (Addendum)
Cardiology Office Note   Date:  07/16/2018   ID:  Sharon Mccall [place], DOB 09-Oct-1965, MRN [id number]  PCP:  Servando Salina, MD  Cardiologist:   Dorris Carnes, MD   Pt presents for f/u of chronic systolic CHF   History of Present Illness: Sharon Mccall is a 52 y.o. female with a history of SOB  I saw her for the first time on June 1   Echo donw showing severe LV dysfunction   Cath on 6/20 showed no CAD  Normal filling pressures    She was seen in clinci by Kathleen Argue in Aug 2019    AT taht time she complained of SOB  He switched her to carvedilol bid from toprol   He also sched a sleep study    She says she feels better    Less SOB   Still gets occasional tiredeness  Denies CP   Breathing is OK   No CP   Functiongin better   Current Meds  Medication Sig  . Aspirin-Acetaminophen-Caffeine (EXCEDRIN MIGRAINE PO) Take 1 tablet by mouth daily as needed (headache).   . CALCIUM-VITAMIN D PO Take 1 tablet by mouth daily.   . carvedilol (COREG) 3.125 MG tablet Take 1 tablet (3.125 mg total) by mouth 2 (two) times daily.  Marland Kitchen ENTRESTO 24-26 MG TAKE 1 TABLET BY MOUTH TWICE DAILY  . esomeprazole (NEXIUM) 20 MG capsule Take 20 mg by mouth daily.  . metoprolol succinate (TOPROL XL) 25 MG 24 hr tablet Take 1 tablet (25 mg total) by mouth daily. Please keep upcoming appt for future refills. Thank you  . rosuvastatin (CRESTOR) 10 MG tablet Take 1 tablet (10 mg total) by mouth daily. Please keep upcoming appt for future refills. Thank you  . Vitamin D, Ergocalciferol, (DRISDOL) 50000 units CAPS capsule Take 50,000 Units by mouth every 7 (seven) days.     Allergies:   Phenergan [promethazine hcl] and Wellbutrin [bupropion]   Past Medical History:  Diagnosis Date  . Body mass index (bmi) 26.0-26.9, adult   . Cervical high risk HPV (human papillomavirus) test positive   . Depression   . Elevated fasting lipid profile   . GERD (gastroesophageal reflux disease)   . Hot flashes   . Hx of  colonoscopy   . Migraines   . PMB (postmenopausal bleeding)   . PMS (premenstrual syndrome)   . Sea sickness   . Uterine fibroid   . Vitamin D deficiency     Past Surgical History:  Procedure Laterality Date  . DILATION AND EVACUATION    . RIGHT/LEFT HEART CATH AND CORONARY ANGIOGRAPHY N/A 02/22/2017   Procedure: Right/Left Heart Cath and Coronary Angiography;  Surgeon: Burnell Blanks, MD;  Location: [facility];  Service: Cardiovascular;  Laterality: N/A;  . TUBAL LIGATION    . UTERINE ARTERY EMBOLIZATION       Social History:  The patient  reports that she quit smoking about 24 years ago. She has never used smokeless tobacco. She reports that she does not drink alcohol or use drugs.   Family History:  The patient's family history includes Colon polyps in her father; Diabetes Mellitus I in her father; Hypertension in her paternal grandmother; Lung cancer in her maternal grandmother; Prostate cancer in her paternal grandfather.    ROS:  Please see the history of present illness. All other systems are reviewed and  Negative to the above problem except as noted.    PHYSICAL EXAM: VS:  BP  110/74   Pulse 74   Ht 5\' 2"  (1.575 m)   Wt 150 lb 3.2 oz (68.1 kg)   LMP 04/14/2011   SpO2 99%   BMI 27.47 kg/m   GEN: Well nourished, well developed, in no acute distress  HEENT: normal  Neck: no JVD, carotid bruits, or masses Cardiac: RRR; no murmurs, rubs, or gallops,no edema  Respiratory:  clear to auscultation bilaterally, normal work of breathing GI: soft, nontender, nondistended, + BS  No hepatomegaly  MS: no deformity Moving all extremities   Skin: warm and dry, no rash Neuro:  Strength and sensation are intact Psych: euthymic mood, full affect   EKG:  EKG is not ordered today.   Lipid Panel    Component Value Date/Time   CHOL 156 04/10/2017 1029   TRIG 118 04/10/2017 1029   HDL 44 04/10/2017 1029   CHOLHDL 3.5 04/10/2017 1029   LDLCALC 88 04/10/2017  1029      Wt Readings from Last 3 Encounters:  07/16/18 150 lb 3.2 oz (68.1 kg)  04/10/18 152 lb 12.8 oz (69.3 kg)  03/15/17 142 lb 12.8 oz (64.8 kg)      ASSESSMENT AND PLAN:  1  Chronic systolic CHF    Volume is good   She  Feels better   Keep on current regimen   Will reflect on aldactone   Patient is not eager to add     2  HL  Profound  Keep on Crestor Good response to it with LDL 88.  F/U in April    Current medicines are reviewed at length with the patient today.  The patient does not have concerns regarding medicines.  Signed, Dorris Carnes, MD  07/16/2018 8:43 AM    [address], [place], [place]  [address] Phone: [phone]; Fax: [phone]

## 2018-09-16 ENCOUNTER — Other Ambulatory Visit: Payer: Self-pay | Admitting: Internal Medicine

## 2019-01-15 ENCOUNTER — Telehealth: Payer: Self-pay

## 2019-01-15 NOTE — Telephone Encounter (Signed)
YOUR CARDIOLOGY TEAM HAS ARRANGED FOR AN E-VISIT FOR YOUR APPOINTMENT - PLEASE REVIEW IMPORTANT INFORMATION BELOW SEVERAL DAYS PRIOR TO YOUR APPOINTMENT  Due to the recent COVID-19 pandemic, we are transitioning in-[place] office visits to tele-medicine visits in an effort to decrease unnecessary exposure to our patients, their families, and staff. These visits are billed to your insurance just like a normal visit is. We also encourage you to sign up for MyChart if you have not already done so. You will need a smartphone if possible. For patients that do not have this, we can still complete the visit using a regular telephone but do prefer a smartphone to enable video when possible. You may have a family member that lives with you that can help. If possible, we also ask that you have a blood pressure cuff and scale at home to measure your blood pressure, heart rate and weight prior to your scheduled appointment. Patients with clinical needs that need an in-[place] evaluation and testing will still be able to come to the office if absolutely necessary. If you have any questions, feel free to call our office.     YOUR PROVIDER WILL BE USING THE FOLLOWING PLATFORM TO COMPLETE YOUR VISIT: Doximity  . IF USING MYCHART - How to Download the MyChart App to Your SmartPhone   - If Apple, go to App Store and type in MyChart in the search bar and download the app. If Android, ask patient to go to Google Play Store and type in MyChart in the search bar and download the app. The app is free but as with any other app downloads, your phone may require you to verify saved payment information or Apple/Android password.  - You will need to then log into the app with your MyChart username and password, and select [facility] as your healthcare provider to link the account.  - When it is time for your visit, go to the MyChart app, find appointments, and click Begin Video Visit. Be sure to Select Allow for your device to  access the Microphone and Camera for your visit. You will then be connected, and your provider will be with you shortly.  **If you have any issues connecting or need assistance, please contact MyChart service desk [phone] ([phone])**  **If using a computer, in order to ensure the best quality for your visit, you will need to use either of the following Internet Browsers: Google Chrome or Microsoft Edge**  . IF USING DOXIMITY or DOXY.ME - The staff will give you instructions on receiving your link to join the meeting the day of your visit.      2-3 DAYS BEFORE YOUR APPOINTMENT  You will receive a telephone call from one of our [facility] team members - your caller ID may say "Unknown caller." If this is a video visit, we will walk you through how to get the video launched on your phone. We will remind you check your blood pressure, heart rate and weight prior to your scheduled appointment. If you have an Apple Watch or Kardia, please upload any pertinent ECG strips the day before or morning of your appointment to MyChart. Our staff will also make sure you have reviewed the consent and agree to move forward with your scheduled tele-health visit.     THE DAY OF YOUR APPOINTMENT  Approximately 15 minutes prior to your scheduled appointment, you will receive a telephone call from one of HeartCare team - your caller ID may say "Unknown caller."    Our staff will confirm medications, vital signs for the day and any symptoms you may be experiencing. Please have this information available prior to the time of visit start. It may also be helpful for you to have a pad of paper and pen handy for any instructions given during your visit. They will also walk you through joining the smartphone meeting if this is a video visit.    CONSENT FOR TELE-HEALTH VISIT - PLEASE REVIEW  I hereby voluntarily request, consent and authorize [facility] and its employed or contracted physicians, physician  assistants, nurse practitioners or other licensed health care professionals (the Practitioner), to provide me with telemedicine health care services (the "Services") as deemed necessary by the treating Practitioner. I acknowledge and consent to receive the Services by the Practitioner via telemedicine. I understand that the telemedicine visit will involve communicating with the Practitioner through live audiovisual communication technology and the disclosure of certain medical information by electronic transmission. I acknowledge that I have been given the opportunity to request an in-[place] assessment or other available alternative prior to the telemedicine visit and am voluntarily participating in the telemedicine visit.  I understand that I have the right to withhold or withdraw my consent to the use of telemedicine in the course of my care at any time, without affecting my right to future care or treatment, and that the Practitioner or I may terminate the telemedicine visit at any time. I understand that I have the right to inspect all information obtained and/or recorded in the course of the telemedicine visit and may receive copies of available information for a reasonable fee.  I understand that some of the potential risks of receiving the Services via telemedicine include:  . Delay or interruption in medical evaluation due to technological equipment failure or disruption; . Information transmitted may not be sufficient (e.g. poor resolution of images) to allow for appropriate medical decision making by the Practitioner; and/or  . In rare instances, security protocols could fail, causing a breach of personal health information.  Furthermore, I acknowledge that it is my responsibility to provide information about my medical history, conditions and care that is complete and accurate to the best of my ability. I acknowledge that Practitioner's advice, recommendations, and/or decision may be based on  factors not within their control, such as incomplete or inaccurate data provided by me or distortions of diagnostic images or specimens that may result from electronic transmissions. I understand that the practice of medicine is not an exact science and that Practitioner makes no warranties or guarantees regarding treatment outcomes. I acknowledge that I will receive a copy of this consent concurrently upon execution via email to the email address I last provided but may also request a printed copy by calling the office of [facility].    I understand that my insurance will be billed for this visit.   I have read or had this consent read to me. . I understand the contents of this consent, which adequately explains the benefits and risks of the Services being provided via telemedicine.  . I have been provided ample opportunity to ask questions regarding this consent and the Services and have had my questions answered to my satisfaction. . I give my informed consent for the services to be provided through the use of telemedicine in my medical care  By participating in this telemedicine visit I agree to the above.  

## 2019-01-16 ENCOUNTER — Other Ambulatory Visit: Payer: Self-pay

## 2019-01-16 ENCOUNTER — Telehealth: Payer: Self-pay | Admitting: *Deleted

## 2019-01-16 ENCOUNTER — Telehealth (INDEPENDENT_AMBULATORY_CARE_PROVIDER_SITE_OTHER): Payer: BC Managed Care – PPO | Admitting: Cardiology

## 2019-01-16 ENCOUNTER — Encounter: Payer: Self-pay | Admitting: Cardiology

## 2019-01-16 VITALS — BP 129/90 | HR 74 | Ht 62.0 in | Wt 154.0 lb

## 2019-01-16 DIAGNOSIS — R5383 Other fatigue: Secondary | ICD-10-CM

## 2019-01-16 DIAGNOSIS — R0683 Snoring: Secondary | ICD-10-CM

## 2019-01-16 DIAGNOSIS — I5042 Chronic combined systolic (congestive) and diastolic (congestive) heart failure: Secondary | ICD-10-CM

## 2019-01-16 DIAGNOSIS — E785 Hyperlipidemia, unspecified: Secondary | ICD-10-CM

## 2019-01-16 MED ORDER — SACUBITRIL-VALSARTAN 49-51 MG PO TABS: 1.0000 | ORAL_TABLET | Freq: Two times a day (BID) | ORAL | 3 refills | Status: DC

## 2019-01-16 NOTE — Telephone Encounter (Signed)
-----   Message from Daune Perch, NP sent at 01/16/2019  3:49 PM EDT ----- Can you place that order for me and run it through insurance to see what her cost would be. ----- Message ----- From: Freada Bergeron, CMA Sent: 01/16/2019   3:34 PM EDT To: Daune Perch, NP  Yes,  a home study is is around $600-$700 maybe a little less and yes she can still get a cpap with that test. Thanks , Gae Bon ----- Message ----- From: Daune Perch, NP Sent: 01/16/2019  10:33 AM EDT To: Freada Bergeron, CMA  Hi Nina,  This patient likely has sleep apnea but she was unable to afford the out of pocket for split night sleep study. Would a home sleep study be any better as far as cost and would this qualify her for CPAP is indicated?  Hollice Gong

## 2019-01-16 NOTE — Patient Instructions (Addendum)
Medication Instructions:  INCREASE: Entresto to 49-51 twice a day   If you need a refill on your cardiac medications before your next appointment, please call your pharmacy.   Lab work: FUTURE: BMET on 01/29/2019  If you have labs (blood work) drawn today and your tests are completely normal, you will receive your results only by: Marland Kitchen MyChart Message (if you have MyChart) OR . A paper copy in the mail If you have any lab test that is abnormal or we need to change your treatment, we will call you to review the results.  Testing/Procedures: None  Follow-Up: Your physician wants you to follow-up in: Dr. Theressa Stamps will receive a reminder letter in the mail two months in advance. If you don't receive a letter, please call our office to schedule the follow-up appointment.  Any Other Special Instructions Will Be Listed Below (If Applicable).   Do the following things EVERY DAY: 1. Monitor you weight.  If you gain 3 pounds overnight or 5 pounds in a week, call the office. 2. Take your medicines as prescribed. If you have concerns about your medications, please call us before you stop taking them.  3. Eat low salt foods-Limit salt (sodium) to 2000 mg per day. This will help prevent your body from holding onto fluid. Read food labels as many processed foods have a lot of sodium, especially canned goods and prepackaged meats. If you would like some assistance choosing low sodium foods, we would be happy to set you up with a nutritionist. 4. Stay as active as you can everyday. Staying active will give you more energy and make your muscles stronger. Start with 5 minutes at a time and work your way up to 30 minutes a day. Break up your activities--do some in the morning and some in the afternoon. Start with 3 days per week and work your way up to 5 days as you can.  If you have chest pain, feel short of breath, dizzy, or lightheaded, STOP. If you don't feel better after a short rest, call 911. If you do feel  better, call the office to let us know you have symptoms with exercise. 5. Limit all fluids for the day to less than 2 liters. Fluid includes all drinks, coffee, juice, ice chips, soup, jello, and all other liquids.    =========================================================================  Sleep Apnea Sleep apnea affects breathing during sleep. It causes breathing to stop for a short time or to become shallow. It can also increase the risk of:  Heart attack.  Stroke.  Being very overweight (obese).  Diabetes.  Heart failure.  Irregular heartbeat. The goal of treatment is to help you breathe normally again. What are the causes? There are three kinds of sleep apnea:  Obstructive sleep apnea. This is caused by a blocked or collapsed airway.  Central sleep apnea. This happens when the brain does not send the right signals to the muscles that control breathing.  Mixed sleep apnea. This is a combination of obstructive and central sleep apnea. The most common cause of this condition is a collapsed or blocked airway. This can happen if:  Your throat muscles are too relaxed.  Your tongue and tonsils are too large.  You are overweight.  Your airway is too small. What increases the risk?  Being overweight.  Smoking.  Having a small airway.  Being older.  Being female.  Drinking alcohol.  Taking medicines to calm yourself (sedatives or tranquilizers).  Having family members with the condition.  What are the signs or symptoms?  Trouble staying asleep.  Being sleepy or tired during the day.  Getting angry a lot.  Loud snoring.  Headaches in the morning.  Not being able to focus your mind (concentrate).  Forgetting things.  Less interest in sex.  Mood swings.  Personality changes.  Feelings of sadness (depression).  Waking up a lot during the night to pee (urinate).  Dry mouth.  Sore throat. How is this diagnosed?  Your medical history.  A  physical exam.  A test that is done when you are sleeping (sleep study). The test is most often done in a sleep lab but may also be done at home. How is this treated?   Sleeping on your side.  Using a medicine to get rid of mucus in your nose (decongestant).  Avoiding the use of alcohol, medicines to help you relax, or certain pain medicines (narcotics).  Losing weight, if needed.  Changing your diet.  Not smoking.  Using a machine to open your airway while you sleep, such as: ? An oral appliance. This is a mouthpiece that shifts your lower jaw forward. ? A CPAP device. This device blows air through a mask when you breathe out (exhale). ? An EPAP device. This has valves that you put in each nostril. ? A BPAP device. This device blows air through a mask when you breathe in (inhale) and breathe out.  Having surgery if other treatments do not work. It is important to get treatment for sleep apnea. Without treatment, it can lead to:  High blood pressure.  Coronary artery disease.  In men, not being able to have an erection (impotence).  Reduced thinking ability. Follow these instructions at home: Lifestyle  Make changes that your doctor recommends.  Eat a healthy diet.  Lose weight if needed.  Avoid alcohol, medicines to help you relax, and some pain medicines.  Do not use any products that contain nicotine or tobacco, such as cigarettes, e-cigarettes, and chewing tobacco. If you need help quitting, ask your doctor. General instructions  Take over-the-counter and prescription medicines only as told by your doctor.  If you were given a machine to use while you sleep, use it only as told by your doctor.  If you are having surgery, make sure to tell your doctor you have sleep apnea. You may need to bring your device with you.  Keep all follow-up visits as told by your doctor. This is important. Contact a doctor if:  The machine that you were given to use during sleep  bothers you or does not seem to be working.  You do not get better.  You get worse. Get help right away if:  Your chest hurts.  You have trouble breathing in enough air.  You have an uncomfortable feeling in your back, arms, or stomach.  You have trouble talking.  One side of your body feels weak.  A part of your face is hanging down. These symptoms may be an emergency. Do not wait to see if the symptoms will go away. Get medical help right away. Call your local emergency services (911 in the [place] Do not drive yourself to the hospital. Summary  This condition affects breathing during sleep.  The most common cause is a collapsed or blocked airway.  The goal of treatment is to help you breathe normally while you sleep. This information is not intended to replace advice given to you by your health care provider. Make sure you  discuss any questions you have with your health care provider. Document Released: 05/31/2008 Document Revised: 04/17/2018 Document Reviewed: 04/17/2018 Elsevier Interactive Patient Education  [employer].

## 2019-01-16 NOTE — Progress Notes (Signed)
Virtual Visit via Telephone Note   This visit type was conducted due to national recommendations for restrictions regarding the COVID-19 Pandemic (e.g. social distancing) in an effort to limit this patient's exposure and mitigate transmission in our community.  Due to her co-morbid illnesses, this patient is at least at moderate risk for complications without adequate follow up.  This format is felt to be most appropriate for this patient at this time.  The patient did not have access to video technology/had technical difficulties with video requiring transitioning to audio format only (telephone).  All issues noted in this document were discussed and addressed.  No physical exam could be performed with this format.  Please refer to the patient's chart for her  consent to telehealth for [facility].   Date:  01/16/2019   ID:  Sharon Mccall, DOB 1966/05/23, MRN [id number]  Patient Location: Home Provider Location: Home  PCP:  Servando Salina, MD  Cardiologist:  Dorris Carnes, MD  Electrophysiologist:  None   Evaluation Performed:  Follow-Up Visit  Chief Complaint:  CHF  History of Present Illness:    Sharon Mccall is a 53 y.o. 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  EF was initially 25-30% in June 2018.  This improved to 35-40% by echocardiogram in December 2018.  Cardiac catheterization in June 2018 demonstrated no evidence of CAD.   She was seen in 04/2018 by Richardson Dopp with complaints of fatigue, which improved with switching Toprol to carvedilol. She was noted to having snoring and sleep study ordered but has not been done.   She is an Building control surveyor at [employer].  Ms. Palka is seen today for follow up. She notes that her ankles swell occasionally, mostly when she has been on feet all day. She has no orthopnea or PND. She occ is awakened choking due to acid reflux. She takes nexium. She also has just started taking  apple cider vinegar gummies this week and has not had any reflux yet this.   She is still fatigued but not as bad as it had been. She cares for her 82 year old granddaughter in the afternoons and often needs a nap after. She has some shortness of breath when walking up stairs or with more exertional activities. She has been to a local school track and walked a few times. She felt well with this. She has no chest discomfort.   She did not have sleep study due to out of pocket cost of $300. She says that she has been told that she stops breathing in her sleep.   The patient does not have symptoms concerning for COVID-19 infection (fever, chills, cough, or new shortness of breath).    Past Medical History:  Diagnosis Date  . Body mass index (bmi) 26.0-26.9, adult   . Cervical high risk HPV (human papillomavirus) test positive   . Depression   . Elevated fasting lipid profile   . GERD (gastroesophageal reflux disease)   . Hot flashes   . Hx of colonoscopy   . Migraines   . PMB (postmenopausal bleeding)   . PMS (premenstrual syndrome)   . Sea sickness   . Uterine fibroid   . Vitamin D deficiency    Past Surgical History:  Procedure Laterality Date  . DILATION AND EVACUATION    . RIGHT/LEFT HEART CATH AND CORONARY ANGIOGRAPHY N/A 02/22/2017   Procedure: Right/Left Heart Cath and Coronary Angiography;  Surgeon: Burnell Blanks, MD;  Location: [facility];  Service: Cardiovascular;  Laterality: N/A;  . TUBAL LIGATION    . UTERINE ARTERY EMBOLIZATION       Current Meds  Medication Sig  . Aspirin-Acetaminophen-Caffeine (EXCEDRIN MIGRAINE PO) Take 1 tablet by mouth daily as needed (headache).   . CALCIUM-VITAMIN D PO Take 1 tablet by mouth daily.   . carvedilol (COREG) 3.125 MG tablet Take 1 tablet (3.125 mg total) by mouth 2 (two) times daily.  Marland Kitchen esomeprazole (NEXIUM) 20 MG capsule Take 20 mg by mouth daily. As needed  . rosuvastatin (CRESTOR) 10 MG tablet Take 1 tablet (10  mg total) by mouth daily.  . [DISCONTINUED] ENTRESTO 24-26 MG TAKE 1 TABLET BY MOUTH TWICE DAILY     Allergies:   Phenergan [promethazine hcl] and Wellbutrin [bupropion]   Social History   Tobacco Use  . Smoking status: Former Smoker    Last attempt to quit: 01/12/1994    Years since quitting: 25.0  . Smokeless tobacco: Never Used  Substance Use Topics  . Alcohol use: No  . Drug use: No     Family Hx: The patient's family history includes Colon polyps in her father; Diabetes Mellitus I in her father; Hypertension in her paternal grandmother; Lung cancer in her maternal grandmother; Prostate cancer in her paternal grandfather. There is no history of Colon cancer, Ovarian cancer, Breast cancer, or Thyroid disease.  ROS:   Please see the history of present illness.     All other systems reviewed and are negative.   Prior CV studies:   The following studies were reviewed today:  Echo 09/01/2017 EF 35-40, diffuse hypokinesis, grade 1 diastolic dysfunction, aortic sclerosis without stenosis, trivial AI, mild MR, mildly reduced RVSF, trivial TR, PASP 20  Right and left cardiac catheterization 02/22/2017 1. No angiographic evidence of CAD 2. Normal filling pressures 3. Non-ischemic cardiomyopathy  Echo 02/15/2017 EF 25-30, diffuse HK, grade 1 diastolic dysfunction, mild AI, mild MR, mild LAE  Labs/Other Tests and Data Reviewed:    EKG:  An ECG dated 04/10/18 was personally reviewed today and demonstrated:  NSR with non-specific intraventricular block  Recent Labs: 04/10/2018: BUN 14; Creatinine, Ser 1.01; Hemoglobin 12.1; NT-Pro BNP 95; Platelets 290; Potassium 4.9; Sodium 139; TSH 1.540   Recent Lipid Panel Lab Results  Component Value Date/Time   CHOL 156 04/10/2017 10:29 AM   TRIG 118 04/10/2017 10:29 AM   HDL 44 04/10/2017 10:29 AM   CHOLHDL 3.5 04/10/2017 10:29 AM   LDLCALC 88 04/10/2017 10:29 AM    Wt Readings from Last 3 Encounters:  01/16/19 154 lb (69.9 kg)   07/16/18 150 lb 3.2 oz (68.1 kg)  04/10/18 152 lb 12.8 oz (69.3 kg)     Objective:    Vital Signs:  BP 129/90   Pulse 74   Ht 5\' 2"  (1.575 m)   Wt 154 lb (69.9 kg)   LMP 04/14/2011   BMI 28.17 kg/m    VITAL SIGNS:  reviewed GEN:  no acute distress RESPIRATORY:  Breathing sounds normal with no wheezing or cough, able to speak in complete sentences. NEURO:  alert and oriented x 3, no obvious focal deficit PSYCH:  normal affect  ASSESSMENT & PLAN:    1. 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  -Pt with only occ ankle swelling at the end of the day, resolved by morning. Has some mild DOE with stronger exertion. No orthopnea or PND. -  Renal function normal on last labs. -Will titrate Entresto to mid range dose which should help with ankle edema and DOE and follow up labs in 10 days for renal function and potassium. Pt instructed to follow BP and watch for lightheadedness and report abnormals.  -We discussed limiting salt intake. -Discussed gradually increasing physical activity to improve overall conditioning.   2. Dyslipidemia -Rosuvastatin 10 mg daily. Followed by GYN and has appt on Monday.   3. Snoring -Pt says she was told that she stops breathing during sleep. Sleep study ordered but she is not able to complete it due to out of pocket cost. I will look into doing a home sleep study and see if more affordable. (message sent to Gershon Cull) -With systolic HF, OSA would be important to treat. I discussed this with the patient.    COVID-19 Education: The signs and symptoms of COVID-19 were discussed with the patient and how to seek care for testing (follow up with PCP or arrange E-visit).  The importance of social distancing was discussed today.  Time:   Today, I have spent 21 minutes with the patient with telehealth technology discussing the above problems.     Medication  Adjustments/Labs and Tests Ordered: Current medicines are reviewed at length with the patient today.  Concerns regarding medicines are outlined above.   Tests Ordered: Orders Placed This Encounter  Procedures  . Basic metabolic panel    Medication Changes: Meds ordered this encounter  Medications  . sacubitril-valsartan (ENTRESTO) 49-51 MG    Sig: Take 1 tablet by mouth 2 (two) times daily.    Dispense:  180 tablet    Refill:  3    Disposition:  Follow up in 6 month(s)  Signed, Daune Perch, NP  01/16/2019 11:14 AM    [place]

## 2019-01-29 ENCOUNTER — Other Ambulatory Visit: Payer: BC Managed Care – PPO

## 2019-02-08 ENCOUNTER — Telehealth: Payer: Self-pay | Admitting: Internal Medicine

## 2019-02-08 ENCOUNTER — Telehealth: Payer: Self-pay | Admitting: *Deleted

## 2019-02-08 DIAGNOSIS — E785 Hyperlipidemia, unspecified: Secondary | ICD-10-CM

## 2019-02-08 NOTE — Telephone Encounter (Signed)
I spoke to the patient and we added a FLP to her lab work on Monday.

## 2019-02-08 NOTE — Telephone Encounter (Signed)
New Message   Patient having lab work done on Monday and wants to know if she could get her cholesterol checked as well since she hasn't done so in a year.

## 2019-02-08 NOTE — Telephone Encounter (Signed)
yes

## 2019-02-08 NOTE — Telephone Encounter (Signed)
    COVID-19 Pre-Screening Questions:  . In the past 7 to 10 days have you had a cough,  shortness of breath, headache, congestion, fever (100 or greater) body aches, chills, sore throat, or sudden loss of taste or sense of smell? . Have you been around anyone with known Covid 19. . Have you been around anyone who is awaiting Covid 19 test results in the past 7 to 10 days? . Have you been around anyone who has been exposed to Covid 19, or has mentioned symptoms of Covid 19 within the past 7 to 10 days?  If you have any concerns/questions about symptoms patients report during screening (either on the phone or at threshold). Contact the provider seeing the patient or DOD for further guidance.  If neither are available contact a member of the leadership team.           Contacted patient via telephone call. No to call Covid 19 questions . Understands the importantce of a mask. KB  

## 2019-02-11 ENCOUNTER — Other Ambulatory Visit: Payer: BC Managed Care – PPO | Admitting: *Deleted

## 2019-02-11 ENCOUNTER — Other Ambulatory Visit: Payer: Self-pay

## 2019-02-11 DIAGNOSIS — I5042 Chronic combined systolic (congestive) and diastolic (congestive) heart failure: Secondary | ICD-10-CM

## 2019-02-11 DIAGNOSIS — E785 Hyperlipidemia, unspecified: Secondary | ICD-10-CM

## 2019-02-11 LAB — BASIC METABOLIC PANEL
BUN/Creatinine Ratio: 13 (ref 9–23)
BUN: 14 mg/dL (ref 6–24)
CO2: 23 mmol/L (ref 20–29)
Calcium: 9.3 mg/dL (ref 8.7–10.2)
Chloride: 107 mmol/L — ABNORMAL HIGH (ref 96–106)
Creatinine, Ser: 1.08 mg/dL — ABNORMAL HIGH (ref 0.57–1.00)
GFR calc Af Amer: 69 mL/min/{1.73_m2} (ref >59)
GFR calc non Af Amer: 60 mL/min/{1.73_m2} (ref >59)
Glucose: 101 mg/dL — ABNORMAL HIGH (ref 65–99)
Potassium: 4.7 mmol/L (ref 3.5–5.2)
Sodium: 143 mmol/L (ref 134–144)

## 2019-02-11 LAB — LIPID PANEL
Chol/HDL Ratio: 3.3 ratio (ref 0.0–4.4)
Cholesterol, Total: 144 mg/dL (ref 100–199)
HDL: 44 mg/dL (ref >39)
LDL Calculated: 79 mg/dL (ref 0–99)
Triglycerides: 106 mg/dL (ref 0–149)
VLDL Cholesterol Cal: 21 mg/dL (ref 5–40)

## 2019-02-13 ENCOUNTER — Telehealth: Payer: Self-pay | Admitting: *Deleted

## 2019-02-13 NOTE — Telephone Encounter (Signed)
PA faxed to [facility] state for HST.

## 2019-02-22 NOTE — Addendum Note (Signed)
Addended by: Freada Bergeron on: 02/22/2019 01:25 PM   Modules accepted: Orders

## 2019-02-26 ENCOUNTER — Telehealth: Payer: Self-pay | Admitting: *Deleted

## 2019-02-26 NOTE — Telephone Encounter (Signed)
-----   Message from Sueanne Margarita, MD sent at 02/06/2019  4:35 PM EDT ----- Regarding: RE: precert Ok to do in lab study ----- Message ----- From: Freada Bergeron, CMA Sent: 02/06/2019   2:43 PM EDT To: Sueanne Margarita, MD Subject: FW: precert                                    Heart failure pt. ----- Message ----- From: Lauralee Evener, CMA Sent: 02/06/2019   8:41 AM EDT To: Freada Bergeron, CMA Subject: RE: precert                                    This patient has HF are you sure they don't want itamir HST? ----- Message ----- From: Freada Bergeron, CMA Sent: 01/16/2019   4:15 PM EDT To: Cv Div Sleep Studies Subject: precert                                        Home sleep test

## 2019-02-26 NOTE — Telephone Encounter (Addendum)
Patient is scheduled for lab study on 03/10/2019. Patient understands her sleep study will be done at [facility] sleep lab. Patient understands she will receive a sleep packet in a week or so. Patient understands to call if she does not receive the sleep packet in a timely manner. Patient was informed if she has any HX of allergies she will have a COVID test. Left detailed message on voicemail with date and time of SS and informed patient to call back at [phone] to confirm or reschedule.

## 2019-03-01 ENCOUNTER — Telehealth: Payer: Self-pay | Admitting: *Deleted

## 2019-03-01 NOTE — Telephone Encounter (Signed)
Per Eddie North with [employer] HST previously requested has been denied. Can order in lab study. Does not need a PA due to plan not participating with [employer]. Appointment changed back to [place],

## 2019-03-10 ENCOUNTER — Encounter (HOSPITAL_BASED_OUTPATIENT_CLINIC_OR_DEPARTMENT_OTHER): Payer: BC Managed Care – PPO

## 2019-03-13 ENCOUNTER — Other Ambulatory Visit: Payer: Self-pay | Admitting: Obstetrics and Gynecology

## 2019-03-13 DIAGNOSIS — Z1231 Encounter for screening mammogram for malignant neoplasm of breast: Secondary | ICD-10-CM

## 2019-04-10 ENCOUNTER — Ambulatory Visit
Admission: EM | Admit: 2019-04-10 | Discharge: 2019-04-10 | Disposition: A | Discharge status: Home / Self Care | Payer: BC Managed Care – PPO | Attending: Emergency Medicine | Admitting: Emergency Medicine

## 2019-04-10 ENCOUNTER — Other Ambulatory Visit: Payer: Self-pay

## 2019-04-10 DIAGNOSIS — M62838 Other muscle spasm: Secondary | ICD-10-CM

## 2019-04-10 MED ORDER — CYCLOBENZAPRINE HCL 5 MG PO TABS: 5.0000 mg | ORAL_TABLET | Freq: Two times a day (BID) | ORAL | 0 refills | Status: AC | PRN
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
## 2019-04-10 NOTE — Discharge Instructions (Signed)
Take muscle relaxer as needed for severe pain, spasm. May ice, rest, elevate area is causing most pain.  Can also use hot compresses/warm wash rags to relieve muscle tightness. May use OTC Tylenol, ibuprofen as needed for pain. Return if you develop worsening pain, chest pain, difficulty breathing.
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
## 2019-04-12 ENCOUNTER — Encounter (HOSPITAL_BASED_OUTPATIENT_CLINIC_OR_DEPARTMENT_OTHER): Payer: BC Managed Care – PPO

## 2019-04-17 ENCOUNTER — Telehealth: Payer: Self-pay

## 2019-04-17 NOTE — Telephone Encounter (Signed)
**Note De-Identified Kendria Halberg Obfuscation** I have started a Entrest[employer] PA through covermymeds. Key: [id number]

## 2019-04-18 NOTE — Telephone Encounter (Signed)
**Note De-Identified Rogen Porte Obfuscation** Letter received Salley Boxley fax from [place] stating that they have approved coverage of the pts Entresto. Approval good from 04/17/2019 until 04/16/2020.  I have notified [place] of this approval.

## 2019-04-23 ENCOUNTER — Ambulatory Visit: Payer: BC Managed Care – PPO | Admitting: Internal Medicine

## 2019-04-24 ENCOUNTER — Other Ambulatory Visit: Payer: Self-pay | Admitting: Physician Assistant

## 2019-05-02 ENCOUNTER — Ambulatory Visit: Payer: BC Managed Care – PPO

## 2019-05-15 ENCOUNTER — Other Ambulatory Visit: Payer: Self-pay

## 2019-05-15 ENCOUNTER — Ambulatory Visit
Admission: RE | Admit: 2019-05-15 | Discharge: 2019-05-15 | Disposition: A | Discharge status: Home / Self Care | Payer: BC Managed Care – PPO | Source: Ambulatory Visit | Attending: Obstetrics and Gynecology | Admitting: Obstetrics and Gynecology

## 2019-05-15 DIAGNOSIS — Z1231 Encounter for screening mammogram for malignant neoplasm of breast: Secondary | ICD-10-CM

## 2019-07-23 NOTE — Progress Notes (Signed)
Virtual Visit via Telephone Note   This visit type was conducted due to national recommendations for restrictions regarding the COVID-19 Pandemic (e.g. social distancing) in an effort to limit this patient's exposure and mitigate transmission in our community.  Due to her co-morbid illnesses, this patient is at least at moderate risk for complications without adequate follow up.  This format is felt to be most appropriate for this patient at this time.  The patient did not have access to video technology/had technical difficulties with video requiring transitioning to audio format only (telephone).  All issues noted in this document were discussed and addressed.  No physical exam could be performed with this format.  Please refer to the patient's chart for her  consent to telehealth for [facility].   Date:  07/24/2019   ID:  Sharon Mccall, DOB 22-May-1966, MRN [id number]  Patient Location: Home Provider Location: Home  PCP:  Servando [place]ina, MD  Cardiologist:  Dorris Carnes, MD  Electrophysiologist:  None   Evaluation Performed:  Follow-Up Visit  Chief Complaint:  6 months follow up  History of Present Illness:    Sharon Mccall is a 53 y.o. female with hx of chronic systolic and diastolic  CHF 2nd to NICM presents for follow up.  EF was initially 25-30% in June 2018. This improved to 35-40% by echocardiogram in December 2018. Cardiac catheterization in June 2018 demonstrated no evidence of CAD. Unable to get sleep study due to cost.  Seen virtually for follow-up.  She reports shortness of breath with exertion.  Relieved with rest.  No associated chest tightness or pressure.  Compliant with her medication.  No regular exercise.  She is a Control and instrumentation engineer.  She denies orthopnea, PND, syncope, lower extremity edema or melena.    The patient does not have symptoms concerning for COVID-19 infection (fever, chills, cough, or new shortness of breath).    Past Medical  History:  Diagnosis Date   Body mass index (bmi) 26.0-26.9, adult    Cervical high risk HPV (human papillomavirus) test positive    Depression    Elevated fasting lipid profile    GERD (gastroesophageal reflux disease)    Hot flashes    Hx of colonoscopy    Migraines    PMB (postmenopausal bleeding)    PMS (premenstrual syndrome)    Sea sickness    Uterine fibroid    Vitamin D deficiency    Past Surgical History:  Procedure Laterality Date   DILATION AND EVACUATION     RIGHT/LEFT HEART CATH AND CORONARY ANGIOGRAPHY N/A 02/22/2017   Procedure: Right/Left Heart Cath and Coronary Angiography;  Surgeon: Burnell Blanks, MD;  Location: [facility];  Service: Cardiovascular;  Laterality: N/A;   TUBAL LIGATION     UTERINE ARTERY EMBOLIZATION       Current Meds  Medication Sig   Aspirin-Acetaminophen-Caffeine (EXCEDRIN MIGRAINE PO) Take 1 tablet by mouth daily as needed (headache).    CALCIUM-VITAMIN D PO Take 1 tablet by mouth daily.    carvedilol (COREG) 3.125 MG tablet Take 1 tablet by mouth twice daily   esomeprazole (NEXIUM) 20 MG capsule Take 20 mg by mouth daily. As needed   rosuvastatin (CRESTOR) 10 MG tablet Take 1 tablet (10 mg total) by mouth daily.   sacubitril-valsartan (ENTRESTO) 49-51 MG Take 1 tablet by mouth 2 (two) times daily.     Allergies:   Phenergan [promethazine hcl] and Wellbutrin [bupropion]   Social History   Tobacco Use  Smoking status: Former Smoker    Quit date: 01/12/1994    Years since quitting: 25.5   Smokeless tobacco: Never Used  Substance Use Topics   Alcohol use: No   Drug use: No     Family Hx: The patient's family history includes Colon polyps in her father; Diabetes Mellitus I in her father; Hypertension in her paternal grandmother; Lung cancer in her maternal grandmother; Prostate cancer in her paternal grandfather. There is no history of Colon cancer, Ovarian cancer, Breast cancer, or Thyroid  disease.  ROS:   Please see the history of present illness.   All other systems reviewed and are negative.   Prior CV studies:   The following studies were reviewed today:  Echo 08/2017 Left ventricle: The cavity size was normal. Wall thickness was   normal. Systolic function was moderately reduced. The estimated   ejection fraction was in the range of 35% to 40%. Diffuse   hypokinesis and incoordinate septal motion. Doppler parameters   are consistent with abnormal left ventricular relaxation (grade 1   diastolic dysfunction). The E/e&' ratio is >15, suggesting   elevated LV filling pressure. Ejection fraction (MOD, 2-plane):   39%. - Aortic valve: Sclerosis without stenosis. There was trivial   regurgitation. - Mitral valve: Mildly thickened leaflets . There was mild   regurgitation. - Left atrium: The atrium was normal in size. - Right ventricle: The cavity size was normal. Wall thickness was   normal. Systolic function is mildly reduced. - Tricuspid valve: There was trivial regurgitation. - Pulmonary arteries: PA peak pressure: 20 mm Hg (S). - Inferior vena cava: The vessel was normal in size. The   respirophasic diameter changes were in the normal range (>= 50%),   consistent with normal central venous pressure.  Impressions:  - Compared to a prior echo in 02/2017, the LVEF has improved from  25-30% up to 35-40%.  Labs/Other Tests and Data Reviewed:    EKG:  No ECG reviewed.  Recent Labs: 02/11/2019: BUN 14; Creatinine, Ser 1.08; Potassium 4.7; Sodium 143   Recent Lipid Panel Lab Results  Component Value Date/Time   CHOL 144 02/11/2019 10:37 AM   TRIG 106 02/11/2019 10:37 AM   HDL 44 02/11/2019 10:37 AM   CHOLHDL 3.3 02/11/2019 10:37 AM   LDLCALC 79 02/11/2019 10:37 AM    Wt Readings from Last 3 Encounters:  01/16/19 154 lb (69.9 kg)  07/16/18 150 lb 3.2 oz (68.1 kg)  04/10/18 152 lb 12.8 oz (69.3 kg)     Objective:    Vital Signs:  LMP 04/14/2011     Patient has a blood pressure cuff at home however has not checked in many months.  She says " I works 7 days a week and do not have time ".  Advised to check periodically.  VITAL SIGNS:  reviewed GEN:  no acute distress PSYCH:  normal affect  ASSESSMENT & PLAN:    1. Chronic combined CHF/NICM Last echocardiogram December 2018 showed LV function of 35 to 40%.  She has dyspnea on exertion which sounds due to deconditioning.  No exercise.  I have encouraged her to build up her exercise regimen.  She will let us know if worsening symptoms or no improvement.  She has no other heart failure symptoms like orthopnea, PND or lower extremity edema.  Will check bmet in B[facility] for evaluation.   2. HLD - Continue statin  3. Snoring - Can not afford sleep study  COVID-19 Education: The signs and  symptoms of COVID-19 were discussed with the patient and how to seek care for testing (follow up with PCP or arrange E-visit).  The importance of social distancing was discussed today.  Time:   Today, I have spent 11 minutes with the patient with telehealth technology discussing the above problems.     Medication Adjustments/Labs and Tests Ordered: Current medicines are reviewed at length with the patient today.  Concerns regarding medicines are outlined above.   Tests Ordered: Orders Placed This Encounter  Procedures   Basic Metabolic Panel (BMET)   Pro b natriuretic peptide    Medication Changes: No orders of the defined types were placed in this encounter.   Follow Up:  Either In [place] or Virtual in 4 month(s)  Signed, Leanor Kail, [place]  07/24/2019 2:22 PM    [facility]

## 2019-07-24 ENCOUNTER — Telehealth (INDEPENDENT_AMBULATORY_CARE_PROVIDER_SITE_OTHER): Payer: BC Managed Care – PPO | Admitting: Physician Assistant

## 2019-07-24 ENCOUNTER — Other Ambulatory Visit: Payer: Self-pay

## 2019-07-24 ENCOUNTER — Encounter: Payer: Self-pay | Admitting: Physician Assistant

## 2019-07-24 DIAGNOSIS — R0683 Snoring: Secondary | ICD-10-CM

## 2019-07-24 DIAGNOSIS — E785 Hyperlipidemia, unspecified: Secondary | ICD-10-CM

## 2019-07-24 DIAGNOSIS — I5042 Chronic combined systolic (congestive) and diastolic (congestive) heart failure: Secondary | ICD-10-CM

## 2019-07-24 NOTE — Patient Instructions (Signed)
Medication Instructions:  Your physician recommends that you continue on your current medications as directed. Please refer to the Current Medication list given to you today.  *If you need a refill on your cardiac medications before your next appointment, please call your pharmacy*  Lab Work: Your physician recommends that you return for lab work on November 23,2020.  BNP and BMP.  Scheduled for 3:25.   If you have labs (blood work) drawn today and your tests are completely normal, you will receive your results only by: Marland Kitchen MyChart Message (if you have MyChart) OR . A paper copy in the mail If you have any lab test that is abnormal or we need to change your treatment, we will call you to review the results.  Testing/Procedures: none  Follow-Up: At [facility], you and your health needs are our priority.  As part of our continuing mission to provide you with exceptional heart care, we have created designated Provider Care Teams.  These Care Teams include your primary Cardiologist (physician) and Advanced Practice Providers (APPs -  Physician Assistants and Nurse Practitioners) who all work together to provide you with the care you need, when you need it.  Your next appointment:   March 5,2020 at 9:40   The format for your next appointment:   Virtual Visit   Provider:   Dorris Carnes, MD  Other Instructions

## 2019-07-29 ENCOUNTER — Other Ambulatory Visit: Payer: BC Managed Care – PPO | Admitting: *Deleted

## 2019-07-29 ENCOUNTER — Other Ambulatory Visit: Payer: Self-pay

## 2019-07-29 DIAGNOSIS — I5042 Chronic combined systolic (congestive) and diastolic (congestive) heart failure: Secondary | ICD-10-CM

## 2019-07-30 LAB — BASIC METABOLIC PANEL
BUN/Creatinine Ratio: 14 (ref 9–23)
BUN: 13 mg/dL (ref 6–24)
CO2: 24 mmol/L (ref 20–29)
Calcium: 9.1 mg/dL (ref 8.7–10.2)
Chloride: 104 mmol/L (ref 96–106)
Creatinine, Ser: 0.91 mg/dL (ref 0.57–1.00)
GFR calc Af Amer: 84 mL/min/{1.73_m2} (ref >59)
GFR calc non Af Amer: 73 mL/min/{1.73_m2} (ref >59)
Glucose: 98 mg/dL (ref 65–99)
Potassium: 4.4 mmol/L (ref 3.5–5.2)
Sodium: 140 mmol/L (ref 134–144)

## 2019-07-30 LAB — PRO B NATRIURETIC PEPTIDE: NT-Pro BNP: 171 pg/mL (ref 0–249)

## 2019-10-20 ENCOUNTER — Other Ambulatory Visit: Payer: Self-pay | Admitting: Internal Medicine

## 2019-11-01 IMAGING — MG MM DIGITAL SCREENING BILAT W/ CAD
4 series · 4 of 4 positions shown · non-contrast
Comparison: None.

ACR Breast Density Category a: The breast tissue is almost entirely
fatty.

CLINICAL DATA: Screening.

EXAM:
DIGITAL SCREENING BILATERAL MAMMOGRAM WITH CAD

[L MLO]
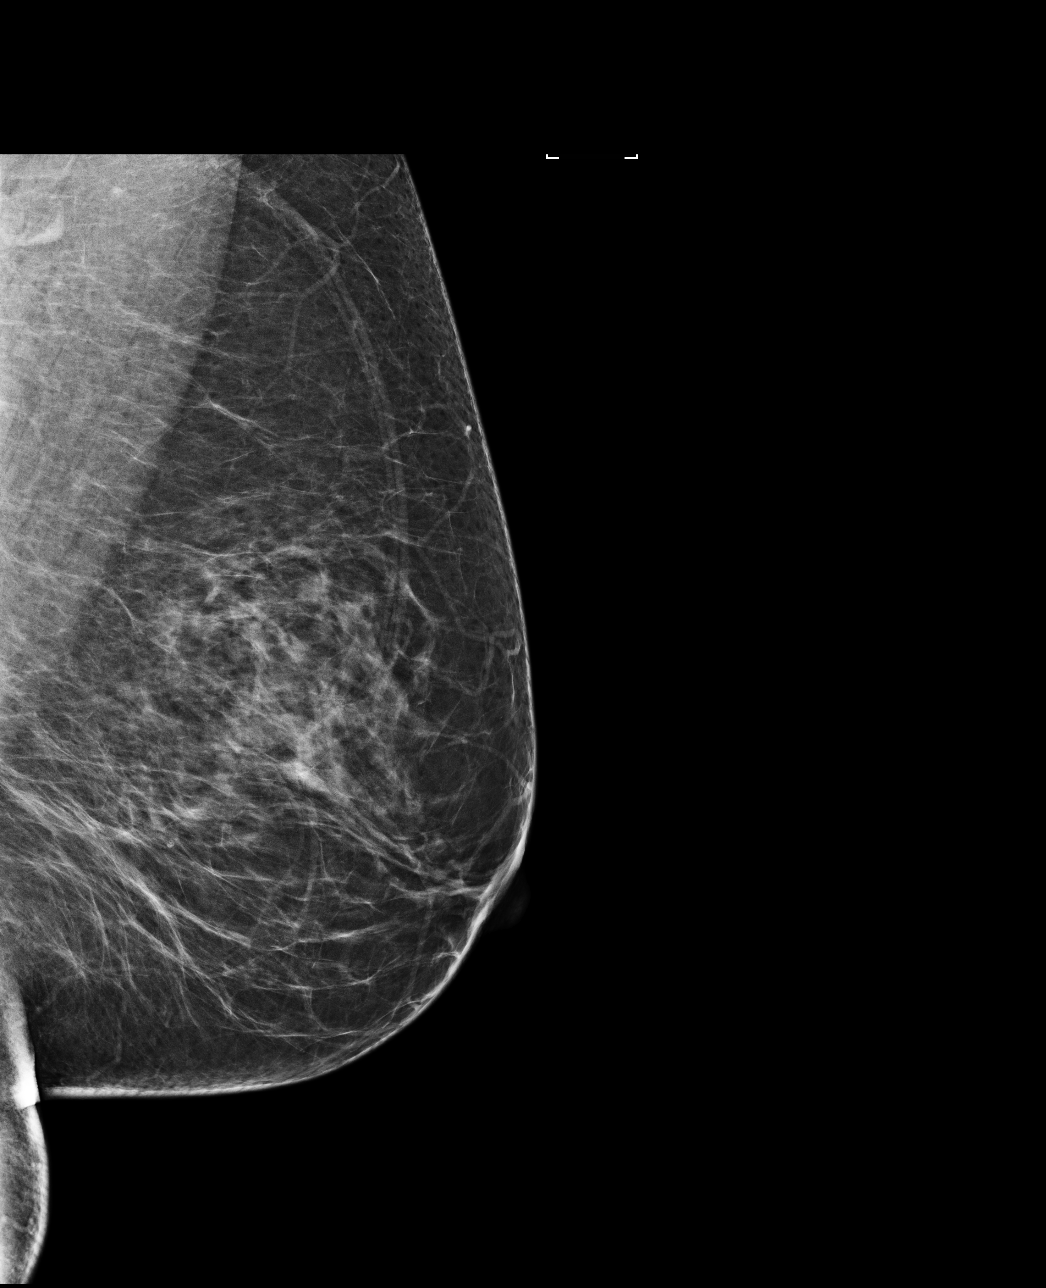

[R MLO]
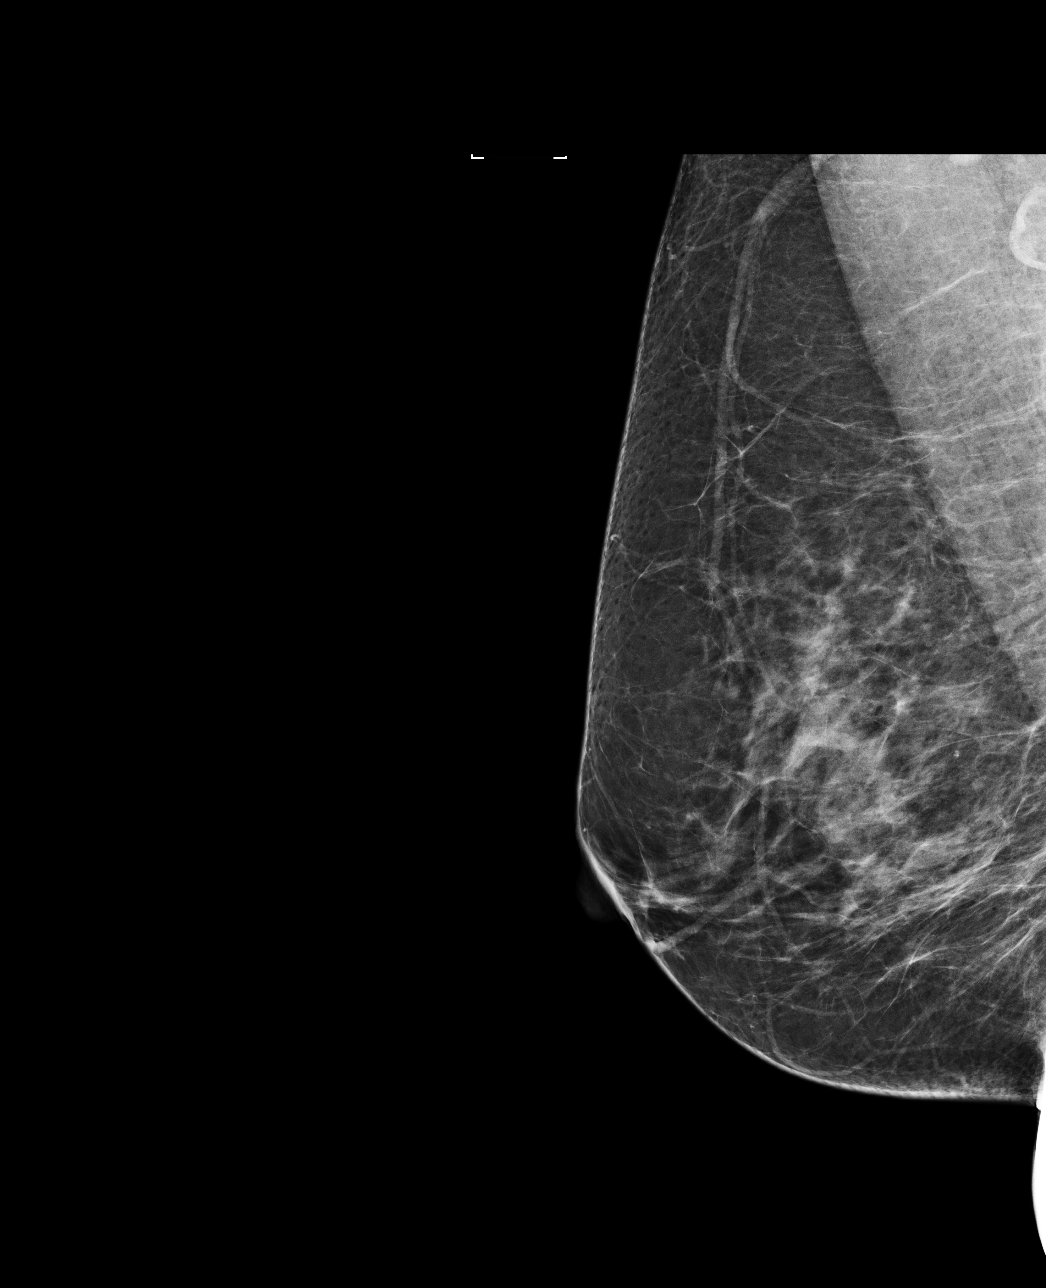

[R CC]
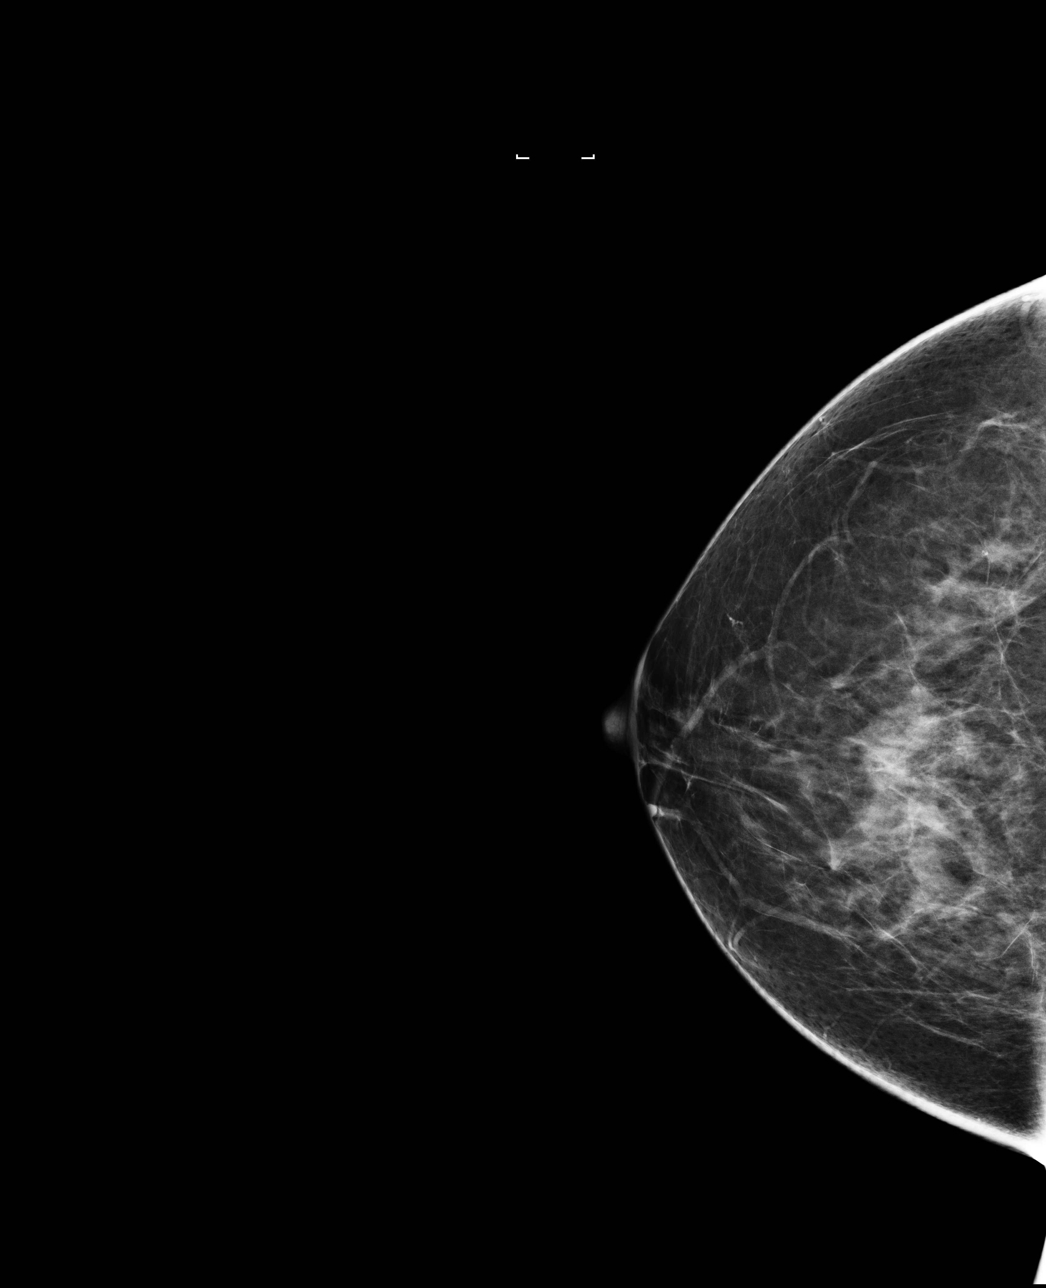

[L CC]
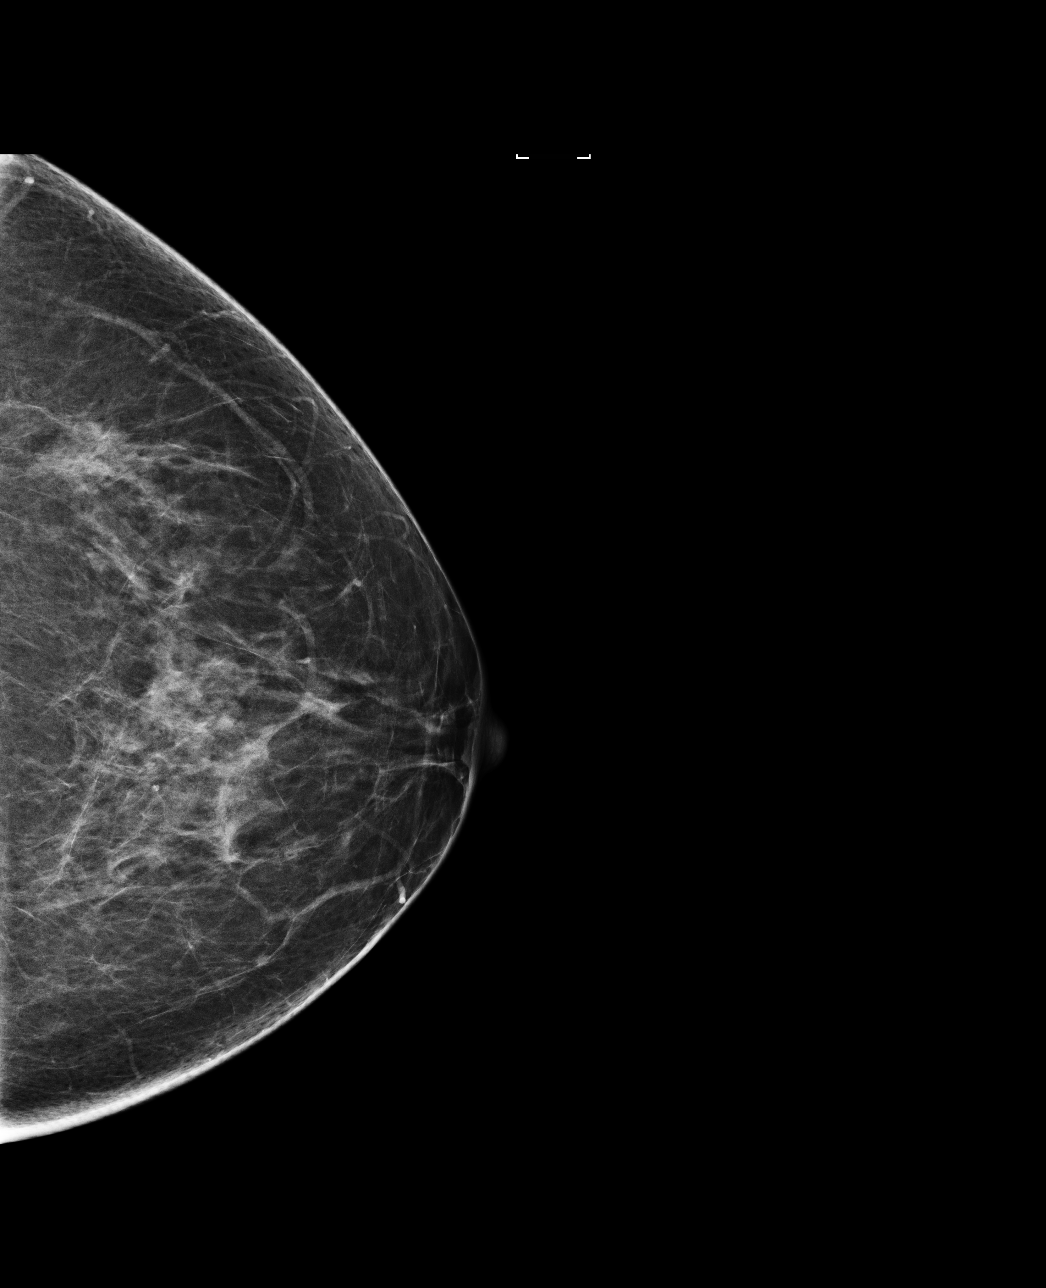

[4 of 4 positions shown; findings below may reference images not displayed]

FINDINGS: There are no findings suspicious for malignancy. Images were
processed with CAD.
IMPRESSION: No mammographic evidence of malignancy. A result letter of this
screening mammogram will be mailed directly to the patient.

RECOMMENDATION:
Screening mammogram in one year. (Code:JT-[id number])

BI-RADS CATEGORY  1: Negative.

## 2019-11-18 ENCOUNTER — Telehealth (INDEPENDENT_AMBULATORY_CARE_PROVIDER_SITE_OTHER): Payer: BC Managed Care – PPO | Admitting: Internal Medicine

## 2019-11-18 ENCOUNTER — Other Ambulatory Visit: Payer: Self-pay

## 2019-11-18 ENCOUNTER — Encounter: Payer: Self-pay | Admitting: Internal Medicine

## 2019-11-18 VITALS — Ht 62.0 in | Wt 142.0 lb

## 2019-11-18 DIAGNOSIS — I428 Other cardiomyopathies: Secondary | ICD-10-CM | POA: Diagnosis not present

## 2019-11-18 DIAGNOSIS — E782 Mixed hyperlipidemia: Secondary | ICD-10-CM | POA: Diagnosis not present

## 2019-11-18 NOTE — Progress Notes (Signed)
Virtual Visit via Telephone Note   This visit type was conducted due to national recommendations for restrictions regarding the COVID-19 Pandemic (e.g. social distancing) in an effort to limit this patient's exposure and mitigate transmission in our community.  Due to her co-morbid illnesses, this patient is at least at moderate risk for complications without adequate follow up.  This format is felt to be most appropriate for this patient at this time.  The patient did not have access to video technology/had technical difficulties with video requiring transitioning to audio format only (telephone).  All issues noted in this document were discussed and addressed.  No physical exam could be performed with this format.  Please refer to the patient's chart for her  consent to telehealth for [facility].   The patient was identified using 2 identifiers.  Date:  11/18/2019   ID:  Sharon Mccall, DOB 02/19/1966, MRN [id number]  Patient Location: Home Provider Location: Office  PCP:  Servando Salina, MD  Cardiologist:  Dorris Carnes, MD  Electrophysiologist:  None   Evaluation Performed:  Follow-Up Visit  Chief Complaint:    History of Present Illness:    Sharon Mccall is a 54 y.o. female with hx of NICM LVEF in 2018 was 25 to 30%  Cardiac cath in June 2018 showed normal coronaries  LVEF improved to 35 to 40% in Dec 2018 She was last seen in vitrual visit by B Bhagat.  Since seen she denies SOB   Rare atypcal CP   No dizzines. No palpitations    The patient does not have symptoms concerning for COVID-19 infection (fever, chills, cough, or new shortness of breath).    Past Medical History:  Diagnosis Date  . Body mass index (bmi) 26.0-26.9, adult   . Cervical high risk HPV (human papillomavirus) test positive   . Depression   . Elevated fasting lipid profile   . GERD (gastroesophageal reflux disease)   . Hot flashes   . Hx of colonoscopy   . Migraines   . PMB  (postmenopausal bleeding)   . PMS (premenstrual syndrome)   . Sea sickness   . Uterine fibroid   . Vitamin D deficiency    Past Surgical History:  Procedure Laterality Date  . DILATION AND EVACUATION    . RIGHT/LEFT HEART CATH AND CORONARY ANGIOGRAPHY N/A 02/22/2017   Procedure: Right/Left Heart Cath and Coronary Angiography;  Surgeon: Burnell Blanks, MD;  Location: [place] [facility];  Service: Cardiovascular;  Laterality: N/A;  . TUBAL LIGATION    . UTERINE ARTERY EMBOLIZATION       Current Meds  Medication Sig  . Aspirin-Acetaminophen-Caffeine (EXCEDRIN MIGRAINE PO) Take 1 tablet by mouth daily as needed (headache).   . CALCIUM-VITAMIN D PO Take 1 tablet by mouth daily.   . carvedilol (COREG) 3.125 MG tablet Take 1 tablet by mouth twice daily  . esomeprazole (NEXIUM) 20 MG capsule Take 20 mg by mouth daily. As needed  . rosuvastatin (CRESTOR) 10 MG tablet Take 1 tablet by mouth once daily  . sacubitril-valsartan (ENTRESTO) 49-51 MG Take 1 tablet by mouth 2 (two) times daily.     Allergies:   Other, Phenergan [promethazine hcl], and Wellbutrin [bupropion]   Social History   Tobacco Use  . Smoking status: Former Smoker    Quit date: 01/12/1994    Years since quitting: 25.8  . Smokeless tobacco: Never Used  Substance Use Topics  . Alcohol use: No  . Drug use: No  Family Hx: The patient's family history includes Colon polyps in her father; Diabetes Mellitus I in her father; Hypertension in her paternal grandmother; Lung cancer in her maternal grandmother; Prostate cancer in her paternal grandfather. There is no history of Colon cancer, Ovarian cancer, Breast cancer, or Thyroid disease.  ROS:   Please see the history of present illness.     All other systems reviewed and are negative.   Prior CV studies:   The following studies were reviewed today:    Labs/Other Tests and Data Reviewed:    EKG:  No ECG reviewed.  Recent Labs: 07/29/2019: BUN 13;  Creatinine, Ser 0.91; NT-Pro BNP 171; Potassium 4.4; Sodium 140   Recent Lipid Panel Lab Results  Component Value Date/Time   CHOL 144 02/11/2019 10:37 AM   TRIG 106 02/11/2019 10:37 AM   HDL 44 02/11/2019 10:37 AM   CHOLHDL 3.3 02/11/2019 10:37 AM   LDLCALC 79 02/11/2019 10:37 AM    Wt Readings from Last 3 Encounters:  11/18/19 142 lb (64.4 kg)  01/16/19 154 lb (69.9 kg)  07/16/18 150 lb 3.2 oz (68.1 kg)     Objective:    Vital Signs:  Ht 5\' 2"  (1.575 m)   Wt 142 lb (64.4 kg)   LMP 04/14/2011   BMI 25.97 kg/m    BP   Pt reports BP 127/79    ASSESSMENT & PLAN:    1. NICM   Pt denies symptoms of volume overload   I would keep on current medicines  She needs to be seen in [place] to evaluate  2  HL  She says she is taking Cresotr   In June LDL was 79     3  Hx snoring   Could not afford sleep study    Plan in [place] visit in 3 months    COVID-19 Education: The signs and symptoms of COVID-19 were discussed with the patient and how to seek care for testing (follow up with PCP or arrange E-visit).  The importance of social distancing was discussed today.  Time:   Today, I have spent 20 minutes with the patient with telehealth technology discussing the above problems.     Medication Adjustments/Labs and Tests Ordered: Current medicines are reviewed at length with the patient today.  Concerns regarding medicines are outlined above.   Tests Ordered: No orders of the defined types were placed in this encounter.   Medication Changes: No orders of the defined types were placed in this encounter.   Follow Up:  In [place] 3 months  Signed, Dorris Carnes, MD  11/18/2019 9:54 AM    [place] \

## 2019-11-18 NOTE — Patient Instructions (Signed)
Medication Instructions:  No changes *If you need a refill on your cardiac medications before your next appointment, please call your pharmacy*   Lab Work: none If you have labs (blood work) drawn today and your tests are completely normal, you will receive your results only by: Marland Kitchen MyChart Message (if you have MyChart) OR . A paper copy in the mail If you have any lab test that is abnormal or we need to change your treatment, we will call you to review the results.   Testing/Procedures: none   Follow-Up: At [facility], you and your health needs are our priority.  As part of our continuing mission to provide you with exceptional heart care, we have created designated Provider Care Teams.  These Care Teams include your primary Cardiologist (physician) and Advanced Practice Providers (APPs -  Physician Assistants and Nurse Practitioners) who all work together to provide you with the care you need, when you need it.  Your next appointment:   3 month(s)  The format for your next appointment:   In [place]  Provider:   Dorris Carnes, MD   Other Instructions

## 2020-02-07 ENCOUNTER — Telehealth: Payer: Self-pay | Admitting: Internal Medicine

## 2020-02-07 MED ORDER — CARVEDILOL 3.125 MG PO TABS: 3.1250 mg | ORAL_TABLET | Freq: Two times a day (BID) | ORAL | 0 refills | Status: DC

## 2020-02-07 MED ORDER — ENTRESTO 49-51 MG PO TABS: 1.0000 | ORAL_TABLET | Freq: Two times a day (BID) | ORAL | 0 refills | Status: DC

## 2020-02-07 MED ORDER — ROSUVASTATIN CALCIUM 10 MG PO TABS: 10.0000 mg | ORAL_TABLET | Freq: Every day | ORAL | 0 refills | Status: DC

## 2020-02-07 NOTE — Telephone Encounter (Signed)
**Note De-Identified Sharon Mccall Obfuscation** Per the pts request I did e-scribe 90 day refills on her Entresto, Carvedilol and Rosuvastatin to [facility] on [address] in [place].  I did call the pt and advised that I sent her refills in to be filled as she requested. She thanked me for letting her know.

## 2020-02-07 NOTE — Telephone Encounter (Signed)
*  STAT* If patient is at the pharmacy, call can be transferred to refill team.   1. Which medications need to be refilled? (please list name of each medication and dose if known)  sacubitril-valsartan (ENTRESTO) 49-51 MG carvedilol (COREG) 3.125 MG tablet rosuvastatin (CRESTOR) 10 MG tablet  2. Which pharmacy/location (including street and city if local pharmacy) is medication to be sent to? [place] ([place]), [place] - [address]   3. Do they need a 30 day or 90 day supply? 90 with refills  Pt was not able to get an appt until 05/04/20 and will need enough meds to last until her appointment

## 2020-05-04 ENCOUNTER — Ambulatory Visit (INDEPENDENT_AMBULATORY_CARE_PROVIDER_SITE_OTHER): Payer: BC Managed Care – PPO | Admitting: Internal Medicine

## 2020-05-04 ENCOUNTER — Encounter: Payer: Self-pay | Admitting: Internal Medicine

## 2020-05-04 ENCOUNTER — Other Ambulatory Visit: Payer: Self-pay

## 2020-05-04 VITALS — BP 116/78 | HR 77 | Ht 62.0 in | Wt 150.2 lb

## 2020-05-04 DIAGNOSIS — I428 Other cardiomyopathies: Secondary | ICD-10-CM

## 2020-05-04 NOTE — Patient Instructions (Signed)
Medication Instructions:  No changes today *If you need a refill on your cardiac medications before your next appointment, please call your pharmacy*   Lab Work: none  Testing/Procedures: Your physician has requested that you have an echocardiogram. Echocardiography is a painless test that uses sound waves to create images of your heart. It provides your doctor with information about the size and shape of your heart and how well your heart's chambers and valves are working. This procedure takes approximately one hour. There are no restrictions for this procedure.   Follow-Up: Follow up with your physician will depend on test results.   Other Instructions

## 2020-05-04 NOTE — Progress Notes (Signed)
Cardiology Office Note   Date:  05/04/2020   ID:  Sharon Mccall, DOB Mar 25, 1966, MRN [id number]  PCP:  Servando Salina, MD  Cardiologist:   Dorris Carnes, MD    Pt presents for f/u of NICM     History of Present Illness: Sharon Mccall is a 54 y.o. female with a history of NICM   LVEF 2 to 30%   Cardiac cath in June 2018 showed normal coronaries.  Repeat echo in Dec 2018 showed    LVEF 35 to 40%.  I saw the pt in March     Since seen she says she still struggles to get her breath.   With walking or doing things she has to slow   She denies CP  Occasional LE edema  No PND    Current Meds  Medication Sig  . Aspirin-Acetaminophen-Caffeine (EXCEDRIN MIGRAINE PO) Take 1 tablet by mouth daily as needed (headache).   . CALCIUM-VITAMIN D PO Take 1 tablet by mouth daily.   . carvedilol (COREG) 3.125 MG tablet Take 1 tablet (3.125 mg total) by mouth 2 (two) times daily.  Marland Kitchen esomeprazole (NEXIUM) 20 MG capsule Take 20 mg by mouth daily. As needed  . rosuvastatin (CRESTOR) 10 MG tablet Take 1 tablet (10 mg total) by mouth daily.  . sacubitril-valsartan (ENTRESTO) 49-51 MG Take 1 tablet by mouth 2 (two) times daily.     Allergies:   Other, Phenergan [promethazine hcl], and Wellbutrin [bupropion]   Past Medical History:  Diagnosis Date  . Body mass index (bmi) 26.0-26.9, adult   . Cervical high risk HPV (human papillomavirus) test positive   . Depression   . Elevated fasting lipid profile   . GERD (gastroesophageal reflux disease)   . Hot flashes   . Hx of colonoscopy   . Migraines   . PMB (postmenopausal bleeding)   . PMS (premenstrual syndrome)   . Sea sickness   . Uterine fibroid   . Vitamin D deficiency     Past Surgical History:  Procedure Laterality Date  . DILATION AND EVACUATION    . RIGHT/LEFT HEART CATH AND CORONARY ANGIOGRAPHY N/A 02/22/2017   Procedure: Right/Left Heart Cath and Coronary Angiography;  Surgeon: Burnell Blanks, MD;   Location: [place] [facility];  Service: Cardiovascular;  Laterality: N/A;  . TUBAL LIGATION    . UTERINE ARTERY EMBOLIZATION       Social History:  The patient  reports that she quit smoking about 26 years ago. She has never used smokeless tobacco. She reports that she does not drink alcohol and does not use drugs.   Family History:  The patient's family history includes Colon polyps in her father; Diabetes Mellitus I in her father; Hypertension in her paternal grandmother; Lung cancer in her maternal grandmother; Prostate cancer in her paternal grandfather.    ROS:  Please see the history of present illness. All other systems are reviewed and  Negative to the above problem except as noted.    PHYSICAL EXAM: VS:  BP 116/78   Pulse 77   Ht 5\' 2"  (1.575 m)   Wt 150 lb 3.2 oz (68.1 kg)   LMP 04/14/2011   SpO2 98%   BMI 27.47 kg/m   GEN: Well nourished, well developed, in no acute distress  HEENT: normal  Neck: JVP is normal  No bruits Cardiac: RRR; no murmurs;  No LE edema Respiratory:  clear to auscultation bilaterally, normal work of breathing GI: soft, nontender, nondistended, +  BS  No hepatomegaly  MS: no deformity Moving all extremities   Skin: warm and dry, no rash Neuro:  Strength and sensation are intact Psych: euthymic mood, full affect   EKG:  EKG is ordered today.  SR 77 bpm   LBBB    Lipid Panel    Component Value Date/Time   CHOL 144 02/11/2019 1037   TRIG 106 02/11/2019 1037   HDL 44 02/11/2019 1037   CHOLHDL 3.3 02/11/2019 1037   LDLCALC 79 02/11/2019 1037      Wt Readings from Last 3 Encounters:  05/04/20 150 lb 3.2 oz (68.1 kg)  11/18/19 142 lb (64.4 kg)  01/16/19 154 lb (69.9 kg)      ASSESSMENT AND PLAN:  1  NICM   Volume status is OK on exam  Still with Symptoms (CL II-III)   WIll repeat echo to reassess LVEF   Review with EP     Keep on current regimen   Consider aldactone    Current medicines are reviewed at length with the patient  today.  The patient does not have concerns regarding medicines.  Signed, Dorris Carnes, MD  05/04/2020 3:31 PM    [facility] [address], [place], [place]  [address] Phone: [phone]; Fax: [phone]

## 2020-05-18 ENCOUNTER — Ambulatory Visit (HOSPITAL_COMMUNITY): Payer: BC Managed Care – PPO | Attending: Cardiology

## 2020-05-18 ENCOUNTER — Other Ambulatory Visit (HOSPITAL_COMMUNITY): Payer: BC Managed Care – PPO

## 2020-05-18 ENCOUNTER — Other Ambulatory Visit: Payer: Self-pay

## 2020-05-18 DIAGNOSIS — I428 Other cardiomyopathies: Secondary | ICD-10-CM | POA: Diagnosis not present

## 2020-05-18 LAB — ECHOCARDIOGRAM COMPLETE
Area-P 1/2: 2.91 cm2
P 1/2 time: 640 msec
S' Lateral: 3.9 cm

## 2020-05-26 ENCOUNTER — Telehealth: Payer: Self-pay | Admitting: *Deleted

## 2020-05-26 DIAGNOSIS — I428 Other cardiomyopathies: Secondary | ICD-10-CM

## 2020-05-26 NOTE — Telephone Encounter (Signed)
-----   Message from [name], MD sent at 05/23/2020  9:53 PM EDT ----- Echo shows pumping function is relatively unchanged from previous echo    LVEF 35 to 40%     Given her SOB I have reviewed with EP   Please refer to Sharon Mccall   Patient may benefit from BiV pacer therapy to improve pumping funciton

## 2020-05-26 NOTE — Telephone Encounter (Signed)
Left detailed message (DPR) of information below and to call back if she'd like to discuss further.  Otherwise, she should look for call from scheduling to make appointment w Dr. Lovena Le.

## 2020-06-02 ENCOUNTER — Other Ambulatory Visit: Payer: Self-pay

## 2020-06-02 ENCOUNTER — Encounter: Payer: Self-pay | Admitting: Internal Medicine

## 2020-06-02 ENCOUNTER — Ambulatory Visit (INDEPENDENT_AMBULATORY_CARE_PROVIDER_SITE_OTHER): Payer: BC Managed Care – PPO | Admitting: Internal Medicine

## 2020-06-02 VITALS — BP 110/74 | HR 70 | Ht 62.5 in | Wt 152.0 lb

## 2020-06-02 DIAGNOSIS — I428 Other cardiomyopathies: Secondary | ICD-10-CM | POA: Diagnosis not present

## 2020-06-02 NOTE — Patient Instructions (Addendum)
Medication Instructions:  Your physician recommends that you continue on your current medications as directed. Please refer to the Current Medication list given to you today.  Labwork: None ordered.  Testing/Procedures: None ordered.  Follow-Up: Your physician wants you to follow-up in: as needed with Dr. Taylor.      Any Other Special Instructions Will Be Listed Below (If Applicable).  If you need a refill on your cardiac medications before your next appointment, please call your pharmacy.   

## 2020-06-02 NOTE — Progress Notes (Signed)
HPI Sharon Mccall is referred today for evaluation of chronic systolic heart failure and LBBB. She is a pleasant 54 yo woman who has a non-ischemic CM. On near maximal medical therapy, she has had an improvement in her symptoms and is class 2. She is working 2 jobs. She has not had syncope. She has trouble walking up incline or stairs. She does not have chest pain or peripheral edema.  Allergies  Allergen Reactions  . Other Other (See Comments)  . Phenergan [Promethazine Hcl] Other (See Comments)    shake  . Wellbutrin [Bupropion] Hives     Current Outpatient Medications  Medication Sig Dispense Refill  . Aspirin-Acetaminophen-Caffeine (EXCEDRIN MIGRAINE PO) Take 1 tablet by mouth daily as needed (headache).     . CALCIUM-VITAMIN D PO Take 1 tablet by mouth daily.     . carvedilol (COREG) 3.125 MG tablet Take 1 tablet (3.125 mg total) by mouth 2 (two) times daily. 180 tablet 0  . esomeprazole (NEXIUM) 20 MG capsule Take 20 mg by mouth daily. As needed    . rosuvastatin (CRESTOR) 10 MG tablet Take 1 tablet (10 mg total) by mouth daily. 90 tablet 0  . sacubitril-valsartan (ENTRESTO) 49-51 MG Take 1 tablet by mouth 2 (two) times daily. 180 tablet 0   No current facility-administered medications for this visit.     Past Medical History:  Diagnosis Date  . Body mass index (bmi) 26.0-26.9, adult   . Cervical high risk HPV (human papillomavirus) test positive   . Depression   . Elevated fasting lipid profile   . GERD (gastroesophageal reflux disease)   . Hot flashes   . Hx of colonoscopy   . Migraines   . PMB (postmenopausal bleeding)   . PMS (premenstrual syndrome)   . Sea sickness   . Uterine fibroid   . Vitamin D deficiency     ROS:   All systems reviewed and negative except as noted in the HPI.   Past Surgical History:  Procedure Laterality Date  . DILATION AND EVACUATION    . RIGHT/LEFT HEART CATH AND CORONARY ANGIOGRAPHY N/A 02/22/2017   Procedure: Right/Left  Heart Cath and Coronary Angiography;  Surgeon: Burnell Blanks, MD;  Location: [place];  Service: Cardiovascular;  Laterality: N/A;  . TUBAL LIGATION    . UTERINE ARTERY EMBOLIZATION       Family History  Problem Relation Age of Onset  . Colon polyps Father   . Diabetes Mellitus I Father   . Lung cancer Maternal Grandmother   . Hypertension Paternal Grandmother   . Prostate cancer Paternal Grandfather   . Colon cancer Neg Hx   . Ovarian cancer Neg Hx   . Breast cancer Neg Hx   . Thyroid disease Neg Hx      Social History   Socioeconomic History  . Marital status: Divorced    Spouse name: Not on file  . Number of children: 2  . Years of education: Not on file  . Highest education level: Not on file  Occupational History  . Not on file  Tobacco Use  . Smoking status: Former Smoker    Quit date: 01/12/1994    Years since quitting: 26.4  . Smokeless tobacco: Never Used  Vaping Use  . Vaping Use: Never used  Substance and Sexual Activity  . Alcohol use: No  . Drug use: No  . Sexual activity: Not on file  Other Topics Concern  . Not on  file  Social History Narrative  . Not on file   Social Determinants of Health   Financial Resource Strain:   . Difficulty of Paying Living Expenses: Not on file  Food Insecurity:   . Worried About Charity fundraiser in the Last Year: Not on file  . Ran Out of Food in the Last Year: Not on file  Transportation Needs:   . Lack of Transportation (Medical): Not on file  . Lack of Transportation (Non-Medical): Not on file  Physical Activity:   . Days of Exercise per Week: Not on file  . Minutes of Exercise per Session: Not on file  Stress:   . Feeling of Stress : Not on file  Social Connections:   . Frequency of Communication with Friends and Family: Not on file  . Frequency of Social Gatherings with Friends and Family: Not on file  . Attends Religious Services: Not on file  . Active Member of Clubs or  Organizations: Not on file  . Attends Archivist Meetings: Not on file  . Marital Status: Not on file  Intimate Partner Violence:   . Fear of Current or Ex-Partner: Not on file  . Emotionally Abused: Not on file  . Physically Abused: Not on file  . Sexually Abused: Not on file     BP 110/74   Pulse 70   Ht 5' 2.5" (1.588 m)   Wt 152 lb (68.9 kg)   LMP 04/14/2011   SpO2 97%   BMI 27.36 kg/m   Physical Exam:  Well appearing NAD HEENT: Unremarkable Neck:  No JVD, no thyromegally Lymphatics:  No adenopathy Back:  No CVA tenderness Lungs:  Clear with no wheezes HEART:  Regular rate rhythm, no murmurs, no rubs, no clicks Abd:  soft, positive bowel sounds, no organomegally, no rebound, no guarding Ext:  2 plus pulses, no edema, no cyanosis, no clubbing Skin:  No rashes no nodules Neuro:  CN II through XII intact, motor grossly intact  EKG - nsr with LBBB, QRS 130.    Assess/Plan: 1. Chronic systolic heart failure - her symptoms are class 2. She is on very good medical therapy and as per Dr. Harrington Challenger' note I agree that initiation of aldactone is very reasonable. At this point her symptoms are not severe enough or her QRS wide enough or her EF not depressed enough to recommend a biv device. She will continue her medical therapy. I will defer initiation of aldactone to Dr. Harrington Challenger.  [name],MD

## 2020-06-14 ENCOUNTER — Other Ambulatory Visit: Payer: Self-pay | Admitting: Internal Medicine

## 2020-06-16 ENCOUNTER — Other Ambulatory Visit: Payer: Self-pay | Admitting: Internal Medicine

## 2020-06-18 ENCOUNTER — Telehealth: Payer: Self-pay

## 2020-06-18 NOTE — Telephone Encounter (Signed)
**Note De-Identified Denali Becvar Obfuscation** I started a Entresto PA through covermymeds: Key: [id number]

## 2020-06-19 NOTE — Telephone Encounter (Signed)
**Note De-Identified Willam Munford Obfuscation** Letter received from [facility] stating that they have approved the pts [place] PA. Approval is valid until 06/18/2021  I have notified [place] of this approval.

## 2020-06-22 ENCOUNTER — Telehealth: Payer: Self-pay | Admitting: Internal Medicine

## 2020-06-22 NOTE — Telephone Encounter (Signed)
Follow Up:      Pt says she received a text stating that her medicine was ready.  She says the cost of the medicine is $141 and it usually costs $10 with the coupon.

## 2020-06-22 NOTE — Telephone Encounter (Signed)
Patient will sign up for new $10 copay coupon at [email or web address] She wanted to let Dr. Harrington Challenger know she was tested for Covid 06/11/20 and was positive.  She is overall good.  Still no taste or smell.

## 2020-06-22 NOTE — Telephone Encounter (Signed)
Pt wants to know about a coupon code she has been using to get her Entresto. She said she usually pays like $10.  She said another coupon that Dr. Harrington Challenger gave her. Right now the medicine is $151. Please call

## 2020-07-16 ENCOUNTER — Other Ambulatory Visit: Payer: Self-pay | Admitting: Internal Medicine

## 2020-12-29 ENCOUNTER — Other Ambulatory Visit: Payer: Self-pay | Admitting: Obstetrics and Gynecology

## 2020-12-29 DIAGNOSIS — Z1231 Encounter for screening mammogram for malignant neoplasm of breast: Secondary | ICD-10-CM

## 2021-02-10 ENCOUNTER — Telehealth: Payer: Self-pay | Admitting: Internal Medicine

## 2021-02-10 NOTE — Telephone Encounter (Signed)
I spoke with patient. She works at [employer]. Needs to quarantine 7 days and plans to go back to work this Friday 02/12/21.  She needs a form completed work will permit her to return.    Michela Pitcher it is because she had Covid infection <60 days ago so it falls under that same "event" even though this is a new infection for her and she tested negative in between.  Pt states she does not have primary care. Dr. Harrington Challenger is the only physician she sees.   She plans to upload the forms and the positive test from [facility] in [place].  Pt aware I am not sure Dr. Harrington Challenger can complete them but I will forward this message to her for review.

## 2021-02-10 NOTE — Telephone Encounter (Signed)
Patient states she tested positive for covid on June 3rd. She states she works for [employer] and they need a form to be filled out by her physician stating she tested positive and needs to quarantine. She states she does not have a PCP. She would like to know if she can bring it by the office.

## 2021-02-12 NOTE — Telephone Encounter (Signed)
Form completed by Dr. Harrington Challenger.  Called pt. She will pick up at front desk on 02/15/21.

## 2021-02-19 NOTE — Telephone Encounter (Signed)
    Pt is calling back, she said she submitted the returning to work letter to her job at [employer] and they approved it, but in order for her to get paid she needs a specific symptoms that prevented her coming to work during her quarantine days.  She said the nurse can call her back so she can explain it more

## 2021-02-23 NOTE — Telephone Encounter (Signed)
Called pt to let her know form completed by Dr. Harrington Challenger.  Copy made for chart.  Pt to pick up addended copy this afternoon.

## 2021-02-23 NOTE — Telephone Encounter (Signed)
Follow Up:     Pt is calling back, concerning  what she needs for her job so she can get paid for being out of work.

## 2021-02-23 NOTE — Telephone Encounter (Signed)
Spoke w pt.  She uploaded the form and states that her employer has approved her leave but that she will not get paid for the time she was off unless there are specific symptoms listed that prevented her from work.  States they told her Covid is a diagnosis but not the symptoms that are to be included.   Will ask Dr. Harrington Challenger to review.  Pt will come by the office to pick up once completed.

## 2021-03-01 ENCOUNTER — Ambulatory Visit
Admission: RE | Admit: 2021-03-01 | Discharge: 2021-03-01 | Disposition: A | Discharge status: Home / Self Care | Payer: BC Managed Care – PPO | Source: Ambulatory Visit | Attending: Obstetrics and Gynecology | Admitting: Obstetrics and Gynecology

## 2021-03-01 ENCOUNTER — Ambulatory Visit: Payer: BC Managed Care – PPO

## 2021-03-01 ENCOUNTER — Other Ambulatory Visit: Payer: Self-pay

## 2021-03-01 DIAGNOSIS — Z1231 Encounter for screening mammogram for malignant neoplasm of breast: Secondary | ICD-10-CM

## 2021-04-09 ENCOUNTER — Other Ambulatory Visit: Payer: Self-pay | Admitting: Internal Medicine

## 2021-06-09 ENCOUNTER — Other Ambulatory Visit: Payer: Self-pay | Admitting: Internal Medicine

## 2021-08-10 ENCOUNTER — Telehealth: Payer: Self-pay | Admitting: Internal Medicine

## 2021-08-10 NOTE — Telephone Encounter (Signed)
Not sure what "itchy"feeling is      Numbness in hands sounds like possible neck issues   Not heart I saw her in 2021    Should have follow up in cardiology

## 2021-08-10 NOTE — Telephone Encounter (Signed)
The patient stated she is having an itching feeling in her chest to the left side. No other symptoms of chest pain, dyspnea (no changes in normal dyspnea for her), nausea, vomiting. The feeling came and went as she was sitting at her desk at work. The first occurrence was today 08/10/21, lasting for about 15 minutes and went away on its own. The patient is also complaining on numbness in bilateral hands for about two weeks. This has occurred when she is laying or sitting. Lasting a few minutes. When she flexes her hands it will gradually go away. No lifestyle changes. Not able to take BP or HR. No missed doses of medication. No weight gain noted or swelling.  Will forward to MD for advisement.

## 2021-08-10 NOTE — Telephone Encounter (Signed)
Patient states her heart feels like it's itching.  It's not pain, just an itching feeling.  She also states both of her hands keep going numb. The number doesn't last long it keeps going on and off.

## 2021-08-11 NOTE — Telephone Encounter (Signed)
I spoke with the Sharon Mccall and made her an appt for 08/13/21 with Laurann Montana NP.

## 2021-08-12 NOTE — Progress Notes (Signed)
Cardiology Office Note:    Date:  08/13/2021   ID:  Sharon Mccall, DOB 1965-10-31, MRN [id number]  PCP:  Servando Salina, MD   [facility] Providers Cardiologist:  Dorris Carnes, MD     Referring MD: Servando Salina, MD   Chief Complaint: itching in her chest and bilateral arm numbness  History of Present Illness:    Sharon Mccall is a very pleasant 55 y.o. female with a hx of NICM, chronic combined systolic and diastolic HF, and hyperlipidemia.   She initially established with our practice in June 2018 for hyperlipidemia referred by GYN. She reported that she was getting short of breath and feeling exhausted most of the time, especially when going up steps.  At the visit she had a abnormal EKG with no previous to compare.  An echocardiogram was ordered which revealed severely reduced LVEF at 25 to 30%.  She was scheduled for right and left heart catheterization.  On cath she had no evidence of CAD, normal filling pressures and nonischemic cardiomyopathy. Medical management of her cardiomyopathy was recommended. Her most recent echocardiogram was 05/18/2020 and was unchanged from previous with LV EF of 35 to 40%. She was referred to Dr. Lovena Le, [place], for consideration of BiV pacemaker but it was not deemed necessary at the time.   She called our office on 08/10/21 with itching in her left chest and bilateral hand numbness and appointment was scheduled for today.   Today, she is here alone and she reports that approximately 1 week ago she was sitting at a desk and felt what she describes as "itching" in her chest in the area of her heart.  She states it was not itching of her skin, it felt like it was coming from inside her chest.  This occurred on 2 occasions on the same day, both times when she was at rest. Has SOB that she feels has been stable since the diagnosis of NICM.  Has trouble going up a flight of steps and often has to stop and rest. She denies chest pain,  fatigue, palpitations, melena, diaphoresis, weakness, presyncope, syncope, orthopnea, and PND.  She has mild nonpitting edema in bilateral lower extremities that she reports sometimes resolves after rest, but sometimes it remains.  She works 2 full-time jobs totaling approximately 16 hours/day and her sleep schedule is a few hours in between jobs.  She also cares for her 56-year-old granddaughter that lives with her.  She is also concerned about numbness and both upper extremities.   Past Medical History:  Diagnosis Date   Body mass index (bmi) 26.0-26.9, adult    Cervical high risk HPV (human papillomavirus) test positive    Depression    Elevated fasting lipid profile    GERD (gastroesophageal reflux disease)    Hot flashes    Hx of colonoscopy    Migraines    PMB (postmenopausal bleeding)    PMS (premenstrual syndrome)    Sea sickness    Uterine fibroid    Vitamin D deficiency     Past Surgical History:  Procedure Laterality Date   DILATION AND EVACUATION     RIGHT/LEFT HEART CATH AND CORONARY ANGIOGRAPHY N/A 02/22/2017   Procedure: Right/Left Heart Cath and Coronary Angiography;  Surgeon: Burnell Blanks, MD;  Location: [facility];  Service: Cardiovascular;  Laterality: N/A;   TUBAL LIGATION     UTERINE ARTERY EMBOLIZATION      Current Medications: Current Meds  Medication Sig   Aspirin-Acetaminophen-Caffeine (  EXCEDRIN MIGRAINE PO) Take 1 tablet by mouth daily as needed (headache).    carvedilol (COREG) 3.125 MG tablet Take 1 tablet (3.125 mg total) by mouth 2 (two) times daily. Please make overdue appt with Dr. Harrington Challenger before anymore refills. Thank you 1st attempt   esomeprazole (NEXIUM) 20 MG capsule Take 20 mg by mouth daily. As needed   rosuvastatin (CRESTOR) 10 MG tablet Take 1 tablet (10 mg total) by mouth daily. Please make overdue appt with Dr. Harrington Challenger before anymore refills. Thank you 1st attempt   sacubitril-valsartan (ENTRESTO) 49-51 MG Take 1 tablet by mouth  2 (two) times daily. Please make overdue appt with Dr. Harrington Challenger before anymore refills. Thank you 1st attempt     Allergies:   Other, Phenergan [promethazine hcl], and Wellbutrin [bupropion]   Social History   Socioeconomic History   Marital status: Divorced    Spouse name: Not on file   Number of children: 2   Years of education: Not on file   Highest education level: Not on file  Occupational History   Not on file  Tobacco Use   Smoking status: Former    Types: Cigarettes    Quit date: 01/12/1994    Years since quitting: 27.6   Smokeless tobacco: Never  Vaping Use   Vaping Use: Never used  Substance and Sexual Activity   Alcohol use: No   Drug use: No   Sexual activity: Not on file  Other Topics Concern   Not on file  Social History Narrative   Not on file   Social Determinants of Health   Financial Resource Strain: Not on file  Food Insecurity: Not on file  Transportation Needs: Not on file  Physical Activity: Not on file  Stress: Not on file  Social Connections: Not on file     Family History: The patient's family history includes Colon polyps in her father; Diabetes Mellitus I in her father; Hypertension in her paternal grandmother; Lung cancer in her maternal grandmother; Prostate cancer in her paternal grandfather. There is no history of Colon cancer, Ovarian cancer, Breast cancer, or Thyroid disease.  ROS:   Please see the history of present illness. All other systems reviewed and are negative.  Labs/Other Studies Reviewed:    The following studies were reviewed today:  Echo 9/21  Left Ventricle: Left ventricular ejection fraction, by estimation, is 35  to 40%. The left ventricle has moderately decreased function. The left  ventricle demonstrates global hypokinesis. The left ventricular internal  cavity size was normal in size. There is no left ventricular hypertrophy. Abnormal (paradoxical) septal motion, consistent with left bundle branch block. Left  ventricular diastolic parameters are indeterminate.  Right Ventricle: The right ventricular size is normal. Right vetricular  wall thickness was not well visualized. Right ventricular systolic  function is mildly reduced.  Left Atrium: Left atrial size was mildly dilated.  Right Atrium: Right atrial size was normal in size.  Pericardium: Trivial pericardial effusion is present.  Mitral Valve: The mitral valve is grossly normal. Trivial mitral valve  regurgitation. No evidence of mitral valve stenosis.  Tricuspid Valve: The tricuspid valve is grossly normal. Tricuspid valve  regurgitation is trivial. No evidence of tricuspid stenosis.  Aortic Valve: The aortic valve is grossly normal. Aortic valve  regurgitation is trivial. Aortic regurgitation PHT measures 640 msec. No  aortic stenosis is present.  Pulmonic Valve: The pulmonic valve was not well visualized. Pulmonic valve  regurgitation is mild. No evidence of pulmonic stenosis.  Aorta:  The aortic root, ascending aorta and aortic arch are all  structurally normal, with no evidence of dilitation or obstruction.  IAS/Shunts: The atrial septum is grossly normal.  valve  regurgitation. No evidence of mitral valve stenosis.    R/LHC 6/18  1.No angiographic evidence of CAD 2. Normal filling pressures 3. Non-ischemic cardiomyopathy   Recommendations: Medical management of her cardiomyopathy. I will not start a beta blocker today given the transient catheter induced heart block in the cath lab. She will need to be started on Coreg and an Ace-inh/ARB at time of follow up.    Recent Labs: No results found for requested labs within last 8760 hours.   Recent Lipid Panel    Component Value Date/Time   CHOL 144 02/11/2019 1037   TRIG 106 02/11/2019 1037   HDL 44 02/11/2019 1037   CHOLHDL 3.3 02/11/2019 1037   LDLCALC 79 02/11/2019 1037    Physical Exam:    VS:  BP 112/70   Pulse 63   Ht 5' 2.5" (1.588 m)   Wt 151 lb (68.5 kg)   LMP  04/14/2011   BMI 27.18 kg/m     Wt Readings from Last 3 Encounters:  08/13/21 151 lb (68.5 kg)  06/02/20 152 lb (68.9 kg)  05/04/20 150 lb 3.2 oz (68.1 kg)     GEN:  Well nourished, well developed in no acute distress HEENT: Normal NECK: No JVD; No carotid bruits LYMPHATICS: No lymphadenopathy CARDIAC: RRR, no murmurs, rubs, gallops RESPIRATORY:  Clear to auscultation without rales, wheezing or rhonchi  ABDOMEN: Soft, non-tender, non-distended MUSCULOSKELETAL:  Mild non-pitting edema bilateral lower extremities; No deformity  SKIN: Warm and dry NEUROLOGIC:  Alert and oriented x 3 PSYCHIATRIC:  Normal affect   EKG:  EKG is  ordered today.  The ekg ordered today demonstrates NSR at rate of 63 bpm with LBBB stable with prior  Diagnoses:    1. Shortness of breath   2. Chronic systolic heart failure ([place])   3. Left bundle branch block (LBBB)   4. Discomfort in chest   5. NICM (nonischemic cardiomyopathy) ([place])   6. Numbness and tingling of upper extremity   7. Hyperlipidemia LDL goal <100    Assessment and Plan:     Discomfort in chest: She describes this as feeling like an "itch" on 2 occasions approximately one week ago, both within the same day. She was sitting at a desk at the time. It has not occurred since that time.  She states she has never felt this feeling before. Low suspicion for angina and do not feel that further ischemic evaluation is needed at this time. As noted below we will update her echocardiogram in the setting of nonischemic cardiomyopathy and chronic heart failure. This would reveal any wall motion abnormalities that may be concerning for ischemia and if so, will pursue further work-up at that time.   Chronic HFrEF/NICM: She reports shortness of breath has persisted since diagnosis of NICM. Denies orthopnea, PND, significant edema. She appears euvolemic today. Has difficulty walking up a flight of stairs; frequently has to stop to rest.  She states she has  not felt improvement since being on Entresto however she is tolerating it well without concern. Blood pressure is well controlled but on the soft side. She is also on carvedilol. If LVEF is depressed, would favor addition of low-dose spironolactone.  Left bundle branch block: Stable by EKG today.  She is not having episodes of syncope, presyncope, chest pain, dizziness or  lightheadedness.  We will get an echo to assess current LVEF.  Continue to follow.  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 No temperature change, skin color change, no tingling, 2+ radial pulses equal bilaterally.  Numbness resolves when she moves her arms around. She admits to standing in one position for long periods of time at work. Encouraged her to pay attention to her posture and to move her extremities around regularly. Further management per PCP.   Hyperlipidemia LDL goal < 100: LDL 97 on 01/29/20.  We will recheck lipid panel today. Continue rosuvastatin.     Disposition: Echo. F/u in 3-4 months with Dr. Harrington Challenger or APP.  Medication Adjustments/Labs and Tests Ordered: Current medicines are reviewed at length with the patient today.  Concerns regarding medicines are outlined above.  Orders Placed This Encounter  Procedures   Basic metabolic panel   CBC   Hepatic function panel   Lipid panel   EKG 12-Lead   ECHOCARDIOGRAM COMPLETE   No orders of the defined types were placed in this encounter.   Patient Instructions  Medication Instructions:  Your Physician recommend you continue on your current medication as directed.    *If you need a refill on your cardiac medications before your next appointment, please call your pharmacy*   Lab Work: Your physician recommends that you return for lab work today- CBC, BMET, Lipid Panel, Liver panel (3rd floor)  If you have labs (blood work) drawn today and  your tests are completely normal, you will receive your results only by: MyChart Message (if you have MyChart) OR A paper copy in the mail If you have any lab test that is abnormal or we need to change your treatment, we will call you to review the results.   Testing/Procedures: Your physician has requested that you have an echocardiogram. Echocardiography is a painless test that uses sound waves to create images of your heart. It provides your doctor with information about the size and shape of your heart and how well your heart's chambers and valves are working. This procedure takes approximately one hour. There are no restrictions for this procedure. [place], you and your health needs are our priority.  As part of our continuing mission to provide you with exceptional heart care, we have created designated Provider Care Teams.  These Care Teams include your primary Cardiologist (physician) and Advanced Practice Providers (APPs -  Physician Assistants and Nurse Practitioners) who all work together to provide you with the care you need, when you need it.  We recommend signing up for the patient portal called "MyChart".  Sign up information is provided on this After Visit Summary.  MyChart is used to connect with patients for Virtual Visits (Telemedicine).  Patients are able to view lab/test results, encounter notes, upcoming appointments, etc.  Non-urgent messages can be sent to your provider as well.   To learn more about what you can do with MyChart, go to [email or web address].    Your next appointment:   3-4 month(s)  The format for your next appointment:   In [place]  Provider:   Dr. Harrington Challenger      Other Instructions None today    Signed, Kailo Kosik, Lanice Schwab, NP  08/13/2021 2:27 PM    [place]

## 2021-08-13 ENCOUNTER — Ambulatory Visit (INDEPENDENT_AMBULATORY_CARE_PROVIDER_SITE_OTHER): Payer: BC Managed Care – PPO | Admitting: Nurse Practitioner

## 2021-08-13 ENCOUNTER — Other Ambulatory Visit: Payer: Self-pay

## 2021-08-13 ENCOUNTER — Encounter (HOSPITAL_BASED_OUTPATIENT_CLINIC_OR_DEPARTMENT_OTHER): Payer: Self-pay | Admitting: Nurse Practitioner

## 2021-08-13 VITALS — BP 112/70 | HR 63 | Ht 62.5 in | Wt 151.0 lb

## 2021-08-13 DIAGNOSIS — R0602 Shortness of breath: Secondary | ICD-10-CM | POA: Diagnosis not present

## 2021-08-13 DIAGNOSIS — E785 Hyperlipidemia, unspecified: Secondary | ICD-10-CM

## 2021-08-13 DIAGNOSIS — I428 Other cardiomyopathies: Secondary | ICD-10-CM

## 2021-08-13 DIAGNOSIS — R0789 Other chest pain: Secondary | ICD-10-CM

## 2021-08-13 DIAGNOSIS — R200 Anesthesia of skin: Secondary | ICD-10-CM

## 2021-08-13 DIAGNOSIS — I447 Left bundle-branch block, unspecified: Secondary | ICD-10-CM | POA: Diagnosis not present

## 2021-08-13 DIAGNOSIS — R202 Paresthesia of skin: Secondary | ICD-10-CM

## 2021-08-13 DIAGNOSIS — I5022 Chronic systolic (congestive) heart failure: Secondary | ICD-10-CM | POA: Diagnosis not present

## 2021-08-13 NOTE — Addendum Note (Signed)
Addended by: Emmaline Life on: 08/13/2021 03:47 PM   Modules accepted: Orders

## 2021-08-13 NOTE — Patient Instructions (Signed)
Medication Instructions:  Your Physician recommend you continue on your current medication as directed.    *If you need a refill on your cardiac medications before your next appointment, please call your pharmacy*   Lab Work: Your physician recommends that you return for lab work today- CBC, BMET, Lipid Panel, Liver panel (3rd floor)  If you have labs (blood work) drawn today and your tests are completely normal, you will receive your results only by: MyChart Message (if you have MyChart) OR A paper copy in the mail If you have any lab test that is abnormal or we need to change your treatment, we will call you to review the results.   Testing/Procedures: Your physician has requested that you have an echocardiogram. Echocardiography is a painless test that uses sound waves to create images of your heart. It provides your doctor with information about the size and shape of your heart and how well your heart's chambers and valves are working. This procedure takes approximately one hour. There are no restrictions for this procedure. [place], you and your health needs are our priority.  As part of our continuing mission to provide you with exceptional heart care, we have created designated Provider Care Teams.  These Care Teams include your primary Cardiologist (physician) and Advanced Practice Providers (APPs -  Physician Assistants and Nurse Practitioners) who all work together to provide you with the care you need, when you need it.  We recommend signing up for the patient portal called "MyChart".  Sign up information is provided on this After Visit Summary.  MyChart is used to connect with patients for Virtual Visits (Telemedicine).  Patients are able to view lab/test results, encounter notes, upcoming appointments, etc.  Non-urgent messages can be sent to your provider as well.   To learn more about what you can do with MyChart, go to  [email or web address].    Your next appointment:   3-4 month(s)  The format for your next appointment:   In [place]  Provider:   Dr. Harrington Challenger      Other Instructions None today

## 2021-08-14 LAB — CBC
Hematocrit: 35.2 % (ref 34.0–46.6)
Hemoglobin: 11.2 g/dL (ref 11.1–15.9)
MCH: 26 pg — ABNORMAL LOW (ref 26.6–33.0)
MCHC: 31.8 g/dL (ref 31.5–35.7)
MCV: 82 fL (ref 79–97)
Platelets: 281 10*3/uL (ref 150–450)
RBC: 4.31 x10E6/uL (ref 3.77–5.28)
RDW: 12.9 % (ref 11.7–15.4)
WBC: 5.8 10*3/uL (ref 3.4–10.8)

## 2021-08-14 LAB — BASIC METABOLIC PANEL
BUN/Creatinine Ratio: 14 (ref 9–23)
BUN: 13 mg/dL (ref 6–24)
CO2: 25 mmol/L (ref 20–29)
Calcium: 9.2 mg/dL (ref 8.7–10.2)
Chloride: 105 mmol/L (ref 96–106)
Creatinine, Ser: 0.94 mg/dL (ref 0.57–1.00)
Glucose: 89 mg/dL (ref 70–99)
Potassium: 4.4 mmol/L (ref 3.5–5.2)
Sodium: 143 mmol/L (ref 134–144)
eGFR: 72 mL/min/{1.73_m2} (ref >59)

## 2021-08-14 LAB — HEPATIC FUNCTION PANEL
ALT: 9 IU/L (ref 0–32)
AST: 14 IU/L (ref 0–40)
Albumin: 4.3 g/dL (ref 3.8–4.9)
Alkaline Phosphatase: 113 IU/L (ref 44–121)
Bilirubin Total: <0.2 mg/dL (ref 0.0–1.2)
Bilirubin, Direct: <0.1 mg/dL (ref 0.00–0.40)
Total Protein: 6.8 g/dL (ref 6.0–8.5)

## 2021-08-14 LAB — LIPID PANEL
Chol/HDL Ratio: 3.5 ratio (ref 0.0–4.4)
Cholesterol, Total: 137 mg/dL (ref 100–199)
HDL: 39 mg/dL — ABNORMAL LOW (ref >39)
LDL Chol Calc (NIH): 71 mg/dL (ref 0–99)
Triglycerides: 157 mg/dL — ABNORMAL HIGH (ref 0–149)
VLDL Cholesterol Cal: 27 mg/dL (ref 5–40)

## 2021-08-16 ENCOUNTER — Telehealth: Payer: Self-pay | Admitting: Internal Medicine

## 2021-08-16 MED ORDER — ENTRESTO 49-51 MG PO TABS: 1.0000 | ORAL_TABLET | Freq: Two times a day (BID) | ORAL | 3 refills | Status: DC

## 2021-08-16 MED ORDER — ROSUVASTATIN CALCIUM 10 MG PO TABS: 10.0000 mg | ORAL_TABLET | Freq: Every day | ORAL | 3 refills | Status: DC

## 2021-08-16 MED ORDER — CARVEDILOL 3.125 MG PO TABS
3.1250 mg | ORAL_TABLET | Freq: Two times a day (BID) | ORAL | 3 refills | Status: DC
Start: 2021-08-16 — End: 2022-08-03

## 2021-08-16 NOTE — Telephone Encounter (Signed)
*  STAT* If patient is at the pharmacy, call can be transferred to refill team.   1. Which medications need to be refilled? (please list name of each medication and dose if known) sacubitril-valsartan (ENTRESTO) 49-51 MG carvedilol (COREG) 3.125 MG tablet rosuvastatin (CRESTOR) 10 MG tablet  2. Which pharmacy/location (including street and city if local pharmacy) is medication to be sent to? [facility] ([place]), [place] [address]  3. Do they need a 30 day or 90 day supply? 90 day supply  Pt is completely out of this medicine

## 2021-08-16 NOTE — Telephone Encounter (Signed)
Pt's medication was sent to pt's pharmacy as requested. Confirmation received.  °

## 2021-08-27 ENCOUNTER — Telehealth: Payer: Self-pay

## 2021-08-27 NOTE — Telephone Encounter (Signed)
P/A was completed on CoverMyMeds for Entresto 49-51mg . The outcome states it is Unknown. I called [facility] to get an update and was told that it had been approved 08/18/2021 through 08/18/2022.  Key: [id number]

## 2021-09-01 ENCOUNTER — Other Ambulatory Visit: Payer: Self-pay

## 2021-09-01 ENCOUNTER — Ambulatory Visit (INDEPENDENT_AMBULATORY_CARE_PROVIDER_SITE_OTHER): Payer: BC Managed Care – PPO

## 2021-09-01 DIAGNOSIS — I5022 Chronic systolic (congestive) heart failure: Secondary | ICD-10-CM | POA: Diagnosis not present

## 2021-09-01 DIAGNOSIS — R0602 Shortness of breath: Secondary | ICD-10-CM

## 2021-09-01 LAB — ECHOCARDIOGRAM COMPLETE
AR max vel: 2.18 cm2
AV Area VTI: 1.95 cm2
AV Area mean vel: 2.14 cm2
AV Mean grad: 3 mmHg
AV Peak grad: 5.8 mmHg
AV Vena cont: 0.08 cm
Ao pk vel: 1.2 m/s
Area-P 1/2: 3.33 cm2
Calc EF: 50 %
P 1/2 time: 608 msec
S' Lateral: 3.91 cm
Single Plane A2C EF: 53.5 %
Single Plane A4C EF: 44.7 %

## 2021-10-14 ENCOUNTER — Other Ambulatory Visit: Payer: Self-pay

## 2021-10-14 ENCOUNTER — Emergency Department (HOSPITAL_BASED_OUTPATIENT_CLINIC_OR_DEPARTMENT_OTHER)
Admission: EM | Admit: 2021-10-14 | Discharge: 2021-10-15 | Disposition: A | Discharge status: Home / Self Care | Payer: BC Managed Care – PPO | Attending: Emergency Medicine | Admitting: Emergency Medicine

## 2021-10-14 ENCOUNTER — Encounter (HOSPITAL_BASED_OUTPATIENT_CLINIC_OR_DEPARTMENT_OTHER): Payer: Self-pay

## 2021-10-14 ENCOUNTER — Emergency Department (HOSPITAL_BASED_OUTPATIENT_CLINIC_OR_DEPARTMENT_OTHER): Payer: BC Managed Care – PPO

## 2021-10-14 DIAGNOSIS — B349 Viral infection, unspecified: Secondary | ICD-10-CM | POA: Diagnosis not present

## 2021-10-14 DIAGNOSIS — R0981 Nasal congestion: Secondary | ICD-10-CM | POA: Diagnosis present

## 2021-10-14 DIAGNOSIS — Z20822 Contact with and (suspected) exposure to covid-19: Secondary | ICD-10-CM | POA: Diagnosis not present

## 2021-10-14 LAB — RESP PANEL BY RT-PCR (FLU A&B, COVID) ARPGX2
Influenza A by PCR: NEGATIVE
Influenza B by PCR: NEGATIVE
SARS Coronavirus 2 by RT PCR: NEGATIVE

## 2021-10-14 NOTE — ED Triage Notes (Signed)
Pt arrives POV with c/o flu like symptoms starting today.  One episode of vomiting earlier tonight.  Had a fever of 101.6 at home.  Took a home Covid test that was negative.  Has a history of cardiomyopathy.

## 2021-10-15 ENCOUNTER — Encounter (HOSPITAL_BASED_OUTPATIENT_CLINIC_OR_DEPARTMENT_OTHER): Payer: Self-pay | Admitting: Emergency Medicine

## 2021-10-15 ENCOUNTER — Other Ambulatory Visit: Payer: Self-pay

## 2021-10-15 ENCOUNTER — Telehealth: Payer: Self-pay | Admitting: Internal Medicine

## 2021-10-15 DIAGNOSIS — B349 Viral infection, unspecified: Secondary | ICD-10-CM | POA: Diagnosis not present

## 2021-10-15 LAB — BASIC METABOLIC PANEL
Anion gap: 9 (ref 5–15)
BUN: 10 mg/dL (ref 6–20)
CO2: 25 mmol/L (ref 22–32)
Calcium: 8.9 mg/dL (ref 8.9–10.3)
Chloride: 105 mmol/L (ref 98–111)
Creatinine, Ser: 0.97 mg/dL (ref 0.44–1.00)
GFR, Estimated: >60 mL/min (ref >60)
Glucose, Bld: 105 mg/dL — ABNORMAL HIGH (ref 70–99)
Potassium: 3.6 mmol/L (ref 3.5–5.1)
Sodium: 139 mmol/L (ref 135–145)

## 2021-10-15 LAB — CBC WITH DIFFERENTIAL/PLATELET
Abs Immature Granulocytes: 0.03 10*3/uL (ref 0.00–0.07)
Basophils Absolute: 0 10*3/uL (ref 0.0–0.1)
Basophils Relative: 0 %
Eosinophils Absolute: 0 10*3/uL (ref 0.0–0.5)
Eosinophils Relative: 0 %
HCT: 35.6 % — ABNORMAL LOW (ref 36.0–46.0)
Hemoglobin: 11.4 g/dL — ABNORMAL LOW (ref 12.0–15.0)
Immature Granulocytes: 0 %
Lymphocytes Relative: 13 %
Lymphs Abs: 1.1 10*3/uL (ref 0.7–4.0)
MCH: 26.3 pg (ref 26.0–34.0)
MCHC: 32 g/dL (ref 30.0–36.0)
MCV: 82 fL (ref 80.0–100.0)
Monocytes Absolute: 0.7 10*3/uL (ref 0.1–1.0)
Monocytes Relative: 8 %
Neutro Abs: 6.7 10*3/uL (ref 1.7–7.7)
Neutrophils Relative %: 79 %
Platelets: 255 10*3/uL (ref 150–400)
RBC: 4.34 MIL/uL (ref 3.87–5.11)
RDW: 12.5 % (ref 11.5–15.5)
WBC: 8.5 10*3/uL (ref 4.0–10.5)
nRBC: 0 % (ref 0.0–0.2)

## 2021-10-15 LAB — TROPONIN I (HIGH SENSITIVITY): Troponin I (High Sensitivity): 3 ng/L (ref <18)

## 2021-10-15 MED ORDER — IBUPROFEN 400 MG PO TABS: 400.0000 mg | ORAL_TABLET | Freq: Four times a day (QID) | ORAL | 0 refills | Status: AC | PRN

## 2021-10-15 NOTE — ED Provider Notes (Signed)
[facility] EMERGENCY DEPT Provider Note   CSN: [id number] Arrival date & time: 10/14/21  2131     History  Chief Complaint  Patient presents with   Generalized Body Aches    Sharon Mccall is a 56 y.o. female.  The history is provided by the patient.  URI Presenting symptoms: congestion, cough, fever and sore throat   Presenting symptoms comment:  Body aches  Congestion:    Location:  Nasal   Interferes with sleep: no     Interferes with eating/drinking: no   Cough:    Cough characteristics:  Non-productive   Severity:  Moderate   Onset quality:  Gradual   Duration:  1 day   Timing:  Sporadic   Progression:  Unchanged   Chronicity:  New Severity:  Moderate Onset quality:  Gradual Duration:  1 day Timing:  Constant Progression:  Unchanged Chronicity:  New Relieved by:  Nothing Worsened by:  Nothing Ineffective treatments:  None tried Associated symptoms: myalgias   Associated symptoms: no arthralgias, no neck pain, no sinus pain, no sneezing, no swollen glands and no wheezing   Risk factors: not elderly       Home Medications Prior to Admission medications   Medication Sig Start Date End Date Taking? Authorizing Provider  ibuprofen (ADVIL) 400 MG tablet Take 1 tablet (400 mg total) by mouth every 6 (six) hours as needed. 10/15/21  Yes Kemya Shed, MD  Aspirin-Acetaminophen-Caffeine (EXCEDRIN MIGRAINE PO) Take 1 tablet by mouth daily as needed (headache).     [provider]  carvedilol (COREG) 3.125 MG tablet Take 1 tablet (3.125 mg total) by mouth 2 (two) times daily. 08/16/21   [name], MD  esomeprazole (NEXIUM) 20 MG capsule Take 20 mg by mouth daily. As needed    [provider]  rosuvastatin (CRESTOR) 10 MG tablet Take 1 tablet (10 mg total) by mouth daily. 08/16/21   [name], MD  sacubitril-valsartan (ENTRESTO) 49-51 MG Take 1 tablet by mouth 2 (two) times daily. 08/16/21   [name], MD       Allergies    Other, Phenergan [promethazine hcl], and Wellbutrin [bupropion]    Review of Systems   Review of Systems  Constitutional:  Positive for fever.  HENT:  Positive for congestion and sore throat. Negative for sinus pain and sneezing.   Eyes:  Negative for redness.  Respiratory:  Positive for cough. Negative for wheezing.   Cardiovascular:  Negative for chest pain, palpitations and leg swelling.  Gastrointestinal:  Negative for abdominal pain.  Genitourinary:  Negative for difficulty urinating.  Musculoskeletal:  Positive for myalgias. Negative for arthralgias and neck pain.  Neurological:  Negative for facial asymmetry.  Psychiatric/Behavioral:  Negative for agitation.   All other systems reviewed and are negative.  Physical Exam Updated Vital Signs BP 100/75    Pulse 79    Temp 99.9 F (37.7 C) (Oral)    Resp (!) 24    Ht 5\' 2"  (1.575 m)    Wt 67.6 kg    LMP 04/14/2011    SpO2 98%    BMI 27.25 kg/m  Physical Exam Vitals and nursing note reviewed.  Constitutional:      General: She is not in acute distress.    Appearance: Normal appearance.  HENT:     Head: Normocephalic and atraumatic.     Nose: Nose normal.  Eyes:     Conjunctiva/sclera: Conjunctivae normal.     Pupils: Pupils are equal,  round, and reactive to light.  Cardiovascular:     Rate and Rhythm: Normal rate and regular rhythm.     Pulses: Normal pulses.     Heart sounds: Normal heart sounds.  Pulmonary:     Effort: Pulmonary effort is normal.     Breath sounds: Normal breath sounds.  Abdominal:     General: Abdomen is flat. Bowel sounds are normal.     Palpations: Abdomen is soft.     Tenderness: There is no abdominal tenderness. There is no guarding.  Musculoskeletal:        General: Normal range of motion.     Cervical back: Normal range of motion and neck supple.  Skin:    General: Skin is warm and dry.     Capillary Refill: Capillary refill takes less than 2 seconds.  Neurological:      General: No focal deficit present.     Mental Status: She is alert and oriented to [place], place, and time.     Deep Tendon Reflexes: Reflexes normal.  Psychiatric:        Mood and Affect: Mood normal.        Behavior: Behavior normal.    ED Results / Procedures / Treatments   Labs (all labs ordered are listed, but only abnormal results are displayed) Results for orders placed or performed during the hospital encounter of 10/14/21  Resp Panel by RT-PCR (Flu A&B, Covid) Nasopharyngeal Swab   Specimen: Nasopharyngeal Swab; Nasopharyngeal(NP) swabs in vial transport medium  Result Value Ref Range   SARS Coronavirus 2 by RT PCR NEGATIVE NEGATIVE   Influenza A by PCR NEGATIVE NEGATIVE   Influenza B by PCR NEGATIVE NEGATIVE  CBC with Differential/Platelet  Result Value Ref Range   WBC 8.5 4.0 - 10.5 K/uL   RBC 4.34 3.87 - 5.11 MIL/uL   Hemoglobin 11.4 (L) 12.0 - 15.0 g/dL   HCT 35.6 (L) 36.0 - 46.0 %   MCV 82.0 80.0 - 100.0 fL   MCH 26.3 26.0 - 34.0 pg   MCHC 32.0 30.0 - 36.0 g/dL   RDW 12.5 11.5 - 15.5 %   Platelets 255 150 - 400 K/uL   nRBC 0.0 0.0 - 0.2 %   Neutrophils Relative % 79 %   Neutro Abs 6.7 1.7 - 7.7 K/uL   Lymphocytes Relative 13 %   Lymphs Abs 1.1 0.7 - 4.0 K/uL   Monocytes Relative 8 %   Monocytes Absolute 0.7 0.1 - 1.0 K/uL   Eosinophils Relative 0 %   Eosinophils Absolute 0.0 0.0 - 0.5 K/uL   Basophils Relative 0 %   Basophils Absolute 0.0 0.0 - 0.1 K/uL   Immature Granulocytes 0 %   Abs Immature Granulocytes 0.03 0.00 - 0.07 K/uL  Basic metabolic panel  Result Value Ref Range   Sodium 139 135 - 145 mmol/L   Potassium 3.6 3.5 - 5.1 mmol/L   Chloride 105 98 - 111 mmol/L   CO2 25 22 - 32 mmol/L   Glucose, Bld 105 (H) 70 - 99 mg/dL   BUN 10 6 - 20 mg/dL   Creatinine, Ser 0.97 0.44 - 1.00 mg/dL   Calcium 8.9 8.9 - 10.3 mg/dL   GFR, Estimated >60 >60 mL/min   Anion gap 9 5 - 15  Troponin I (High Sensitivity)  Result Value Ref Range   Troponin I (High  Sensitivity) 3 <18 ng/L   DG Chest Portable 1 View  Result Date: 10/14/2021 CLINICAL DATA:  Body aches  and chest discomfort EXAM: PORTABLE CHEST 1 VIEW COMPARISON:  None. FINDINGS: Heart size is normal. Mediastinal shadows are normal. The lungs are clear. No effusions. No abnormal bone finding. IMPRESSION: No active disease. Electronically Signed   By: Nelson Chimes M.D.   On: 10/14/2021 23:43     Radiology DG Chest Portable 1 View  Result Date: 10/14/2021 CLINICAL DATA:  Body aches and chest discomfort EXAM: PORTABLE CHEST 1 VIEW COMPARISON:  None. FINDINGS: Heart size is normal. Mediastinal shadows are normal. The lungs are clear. No effusions. No abnormal bone finding. IMPRESSION: No active disease. Electronically Signed   By: Nelson Chimes M.D.   On: 10/14/2021 23:43    Procedures Procedures    Medications Ordered in ED   ED Course/ Medical Decision Making/ A&P                           Medical Decision Making URI symptoms with fever and body aches that started yesterday.  One episode of emesis with sore throat therafter.  No diarrhea.    Amount and/or Complexity of Data Reviewed Independent Historian:     Details: Son related above details Labs: ordered.    Details: normal CBC, normal electrolytes negative troponin Radiology: ordered.    Details: normal chest XRAY no PNA  Risk Prescription drug management. Risk Details: Very well appearing, normal vitals and exam.  All tests are negative.  No signs of sepsis on exam or labs and XRAy.       Final Clinical Impression(s) / ED Diagnoses Final diagnoses:  Viral syndrome   Return for intractable cough, coughing up blood, fevers > 100.4 unrelieved by medication, shortness of breath, intractable vomiting, chest pain, shortness of breath, weakness, numbness, changes in speech, facial asymmetry, abdominal pain, passing out, Inability to tolerate liquids or food, cough, altered mental status or any concerns. No signs of systemic  illness or infection. The patient is nontoxic-appearing on exam and vital signs are within normal limits.  I have reviewed the triage vital signs and the nursing notes. Pertinent labs & imaging results that were available during my care of the patient were reviewed by me and considered in my medical decision making (see chart for details). After history, exam, and medical workup I feel the patient has been appropriately medically screened and is safe for discharge home. Pertinent diagnoses were discussed with the patient. Patient was given return precautions. Rx / DC Orders ED Discharge Orders          Ordered    ibuprofen (ADVIL) 400 MG tablet  Every 6 hours PRN        10/15/21 0144              Mattheus Rauls, MD 10/15/21 0151

## 2021-10-15 NOTE — Telephone Encounter (Signed)
Pt went to ER last night due to fever and light symptoms.. negative for covid and flu.. pt has new virus (no name given) pt needs to be tested for strep and needs to know where to be tested at.

## 2021-10-15 NOTE — Telephone Encounter (Signed)
Pt was calling in to ask where she could go to get a strep test done. She states she went to the ER for cough and fever, and tested negative for covid and flu, but wasn't tested for strep. Advised the pt she could call her PCP and get into see them to do a strep, or she could refer to her local urgent care for assistance with this. Pt verbalized understanding and agrees with this plan. She states she will head over to urgent care now for assistance with this. Pt verbalized understanding and agrees with this plan.  Pt was gracious for all the assistance provided.

## 2021-12-22 NOTE — Progress Notes (Signed)
? ?Cardiology Office Note ? ? ?Date:  12/25/2021  ? ?ID:  Sharon Mccall, DOB 06/03/1966, MRN [id number] ? ?PCP:  Servando Salina, MD  ?Cardiologist:   Dorris Carnes, MD  ? ? ?Pt presents for f/u of NICM   ?  ?History of Present Illness: ?Sharon Mccall is a 56 y.o. female with a history of NICM   LVEF 25 to 30%   Cardiac cath in June 2018 showed normal coronaries.  Repeat echo in Dec 2018 showed    LVEF 35 to 40%. ? ? ?I saw the pt in Aug 2021   Since seen she has been seen by Beckie Salts, felt no indication for device.   At the last visit with Elwyn Reach  in Dec 2022 she complained of "itching" sensation in chest   Occurred at rest    Noted no change in breathing  She was set up for an echocardiogram    LVEF unchanged at 35 to 40% ? ?Since she was last seen she says her breathing is about the same  Still gets SOB with activity     She denies CP   She denies LE edema   Activity is some walking   ? ?Current Meds  ?Medication Sig  ? Aspirin-Acetaminophen-Caffeine (EXCEDRIN MIGRAINE PO) Take 1 tablet by mouth daily as needed (headache).   ? carvedilol (COREG) 3.125 MG tablet Take 1 tablet (3.125 mg total) by mouth 2 (two) times daily.  ? esomeprazole (NEXIUM) 20 MG capsule Take 20 mg by mouth daily. As needed  ? ibuprofen (ADVIL) 400 MG tablet Take 1 tablet (400 mg total) by mouth every 6 (six) hours as needed.  ? rosuvastatin (CRESTOR) 10 MG tablet Take 1 tablet (10 mg total) by mouth daily.  ? sacubitril-valsartan (ENTRESTO) 49-51 MG Take 1 tablet by mouth 2 (two) times daily.  ? ? ? ?Allergies:   Other, Phenergan [promethazine hcl], and Wellbutrin [bupropion]  ? ?Past Medical History:  ?Diagnosis Date  ? Body mass index (bmi) 26.0-26.9, adult   ? Cervical high risk HPV (human papillomavirus) test positive   ? Depression   ? Elevated fasting lipid profile   ? GERD (gastroesophageal reflux disease)   ? Hot flashes   ? Hx of colonoscopy   ? Migraines   ? PMB (postmenopausal bleeding)   ? PMS (premenstrual  syndrome)   ? Sea sickness   ? Uterine fibroid   ? Vitamin D deficiency   ? ? ?Past Surgical History:  ?Procedure Laterality Date  ? DILATION AND EVACUATION    ? RIGHT/LEFT HEART CATH AND CORONARY ANGIOGRAPHY N/A 02/22/2017  ? Procedure: Right/Left Heart Cath and Coronary Angiography;  Surgeon: Burnell Blanks, MD;  Location: [facility];  Service: Cardiovascular;  Laterality: N/A;  ? TUBAL LIGATION    ? UTERINE ARTERY EMBOLIZATION    ? ? ? ?Social History:  The patient  reports that she quit smoking about 27 years ago. Her smoking use included cigarettes. She has never used smokeless tobacco. She reports that she does not drink alcohol and does not use drugs.  ? ?Family History:  The patient's family history includes Colon polyps in her father; Diabetes Mellitus I in her father; Hypertension in her paternal grandmother; Lung cancer in her maternal grandmother; Prostate cancer in her paternal grandfather.  ? ? ?ROS:  Please see the history of present illness. All other systems are reviewed and  Negative to the above problem except as noted.  ? ? ?PHYSICAL  EXAM: ?VS:  BP 108/70   Pulse 80   Ht '5\' 2"'$  (1.575 m)   Wt 148 lb 6.4 oz (67.3 kg)   LMP 04/14/2011   SpO2 97%   BMI 27.14 kg/m?   ?GEN: Overweight 56 yo in no acute distress  ?HEENT: normal  ?Neck: JVP is normal  No bruits ?Cardiac: RRR; no murmurs;  No LE edema ?Respiratory:  clear to auscultation  ?GI: soft, nontender, nondistended, + BS  No hepatomegaly  ?MS: no deformity Moving all extremities   ?Skin: warm and dry, no rash ?Neuro:  Strength and sensation are intact ?Psych: euthymic mood, full affect ? ? ?EKG:  EKG is not ordered today.  ? ? ?Lipid Panel ?   ?Component Value Date/Time  ? CHOL 137 08/13/2021 1101  ? TRIG 157 (H) 08/13/2021 1101  ? HDL 39 (L) 08/13/2021 1101  ? CHOLHDL 3.5 08/13/2021 1101  ? [place] 71 08/13/2021 1101  ? ?  ? ?Wt Readings from Last 3 Encounters:  ?12/23/21 148 lb 6.4 oz (67.3 kg)  ?10/14/21 149 lb (67.6 kg)   ?08/13/21 151 lb (68.5 kg)  ?  ? ? ?ASSESSMENT AND PLAN: ? ?1  NICM   Cath in 2018 showed no CAD Echo in Dec 2022 showed LVEF 35 to 40%   Volume looks good  She still gets winded with activities. ?  She is not on spironolactone   Could tolerate     I would add 12.5.  I would also recomm Jardiance    THis would complete GDMT ?Would recomm follow up BMET in 10 days      ?Pt to notify how tolerates ? ? ? ?Current medicines are reviewed at length with the patient today.  The patient does not have concerns regarding medicines. ? ?Signed, ?Dorris Carnes, MD  ?12/25/2021 11:10 AM    ?[facility] ?[address], [place], [place]  [address] ?Phone: [phone]; Fax: [phone]  ? ? ?

## 2021-12-23 ENCOUNTER — Ambulatory Visit (INDEPENDENT_AMBULATORY_CARE_PROVIDER_SITE_OTHER): Payer: BC Managed Care – PPO | Admitting: Internal Medicine

## 2021-12-23 ENCOUNTER — Encounter: Payer: Self-pay | Admitting: Internal Medicine

## 2021-12-23 VITALS — BP 108/70 | HR 80 | Ht 62.0 in | Wt 148.4 lb

## 2021-12-23 DIAGNOSIS — I428 Other cardiomyopathies: Secondary | ICD-10-CM | POA: Diagnosis not present

## 2021-12-23 NOTE — Patient Instructions (Signed)
Medication Instructions:  ?Your physician recommends that you continue on your current medications as directed. Please refer to the Current Medication list given to you today. ? ?*If you need a refill on your cardiac medications before your next appointment, please call your pharmacy* ? ? ?Lab Work: ?none ?If you have labs (blood work) drawn today and your tests are completely normal, you will receive your results only by: ?MyChart Message (if you have MyChart) OR ?A paper copy in the mail ?If you have any lab test that is abnormal or we need to change your treatment, we will call you to review the results. ? ? ?Testing/Procedures: ?none ? ? ?Follow-Up: ?At [facility], you and your health needs are our priority.  As part of our continuing mission to provide you with exceptional heart care, we have created designated Provider Care Teams.  These Care Teams include your primary Cardiologist (physician) and Advanced Practice Providers (APPs -  Physician Assistants and Nurse Practitioners) who all work together to provide you with the care you need, when you need it. ? ?We recommend signing up for the patient portal called "MyChart".  Sign up information is provided on this After Visit Summary.  MyChart is used to connect with patients for Virtual Visits (Telemedicine).  Patients are able to view lab/test results, encounter notes, upcoming appointments, etc.  Non-urgent messages can be sent to your provider as well.   ?To learn more about what you can do with MyChart, go to [email or web address].   ? ?Your next appointment:   ?6 month(s) ? ?The format for your next appointment:   ?In [place] ? ?Provider:   ?Dorris Carnes, MD   ? ? ?Other Instructions ? ? ?Important Information About Sugar ? ? ? ? ?  ?

## 2021-12-31 ENCOUNTER — Telehealth: Payer: Self-pay | Admitting: Internal Medicine

## 2021-12-31 DIAGNOSIS — Z79899 Other long term (current) drug therapy: Secondary | ICD-10-CM

## 2021-12-31 DIAGNOSIS — I428 Other cardiomyopathies: Secondary | ICD-10-CM

## 2021-12-31 DIAGNOSIS — I5022 Chronic systolic (congestive) heart failure: Secondary | ICD-10-CM

## 2021-12-31 NOTE — Telephone Encounter (Signed)
After review of record I would recomm adding aldactone 12.5 mg and Jardiance 10 mg to complete goal directed medical therapy for CHF    ?Her BP should tolerate this, if concerned can start 1 at at time     I would follow up BMET in 2 wks    ?

## 2022-01-03 NOTE — Telephone Encounter (Signed)
Left a message for the pt to call back.  

## 2022-01-05 ENCOUNTER — Ambulatory Visit (INDEPENDENT_AMBULATORY_CARE_PROVIDER_SITE_OTHER): Payer: BC Managed Care – PPO

## 2022-01-05 ENCOUNTER — Ambulatory Visit
Admission: EM | Admit: 2022-01-05 | Discharge: 2022-01-05 | Disposition: A | Discharge status: Home / Self Care | Payer: BC Managed Care – PPO | Attending: Urgent Care | Admitting: Urgent Care

## 2022-01-05 DIAGNOSIS — M25572 Pain in left ankle and joints of left foot: Secondary | ICD-10-CM

## 2022-01-05 DIAGNOSIS — M25561 Pain in right knee: Secondary | ICD-10-CM | POA: Diagnosis not present

## 2022-01-05 DIAGNOSIS — I428 Other cardiomyopathies: Secondary | ICD-10-CM

## 2022-01-05 MED ORDER — SPIRONOLACTONE 25 MG PO TABS: 12.5000 mg | ORAL_TABLET | Freq: Every day | ORAL | 3 refills | Status: DC

## 2022-01-05 MED ORDER — NAPROXEN 375 MG PO TABS: 375.0000 mg | ORAL_TABLET | Freq: Two times a day (BID) | ORAL | 0 refills | Status: DC

## 2022-01-05 MED ORDER — EMPAGLIFLOZIN 10 MG PO TABS: 10.0000 mg | ORAL_TABLET | Freq: Every day | ORAL | 3 refills | Status: DC

## 2022-01-05 NOTE — ED Triage Notes (Signed)
Pt c/o falling down stairs yesterday. States she has multiple sore spots through body but hurts mostly on the lateral left ankle and the right knee.  ?

## 2022-01-05 NOTE — ED Provider Notes (Signed)
?[place] ? ? ?MRN: [id number] DOB: Mar 26, 1966 ? ?Subjective:  ? ?Sharon Mccall is a 56 y.o. female presenting for persistent mild to moderate right knee pain and moderate left ankle pain.  Symptoms started from suffering a fall accidentally down the stairs yesterday.  The primary problem is her ankle but would like to be evaluated for her right knee as well.  Does not take medicines for relief.  She is able to bear weight but with pain. ? ?No current facility-administered medications for this encounter. ? ?Current Outpatient Medications:  ?  Aspirin-Acetaminophen-Caffeine (EXCEDRIN MIGRAINE PO), Take 1 tablet by mouth daily as needed (headache). , Disp: , Rfl:  ?  carvedilol (COREG) 3.125 MG tablet, Take 1 tablet (3.125 mg total) by mouth 2 (two) times daily., Disp: 180 tablet, Rfl: 3 ?  esomeprazole (NEXIUM) 20 MG capsule, Take 20 mg by mouth daily. As needed, Disp: , Rfl:  ?  ibuprofen (ADVIL) 400 MG tablet, Take 1 tablet (400 mg total) by mouth every 6 (six) hours as needed., Disp: 16 tablet, Rfl: 0 ?  rosuvastatin (CRESTOR) 10 MG tablet, Take 1 tablet (10 mg total) by mouth daily., Disp: 90 tablet, Rfl: 3 ?  sacubitril-valsartan (ENTRESTO) 49-51 MG, Take 1 tablet by mouth 2 (two) times daily., Disp: 180 tablet, Rfl: 3  ? ?Allergies  ?Allergen Reactions  ? Other Other (See Comments)  ? Phenergan [Promethazine Hcl] Other (See Comments)  ?  shake  ? Wellbutrin [Bupropion] Hives  ? ? ?Past Medical History:  ?Diagnosis Date  ? Body mass index (bmi) 26.0-26.9, adult   ? Cervical high risk HPV (human papillomavirus) test positive   ? Depression   ? Elevated fasting lipid profile   ? GERD (gastroesophageal reflux disease)   ? Hot flashes   ? Hx of colonoscopy   ? Migraines   ? PMB (postmenopausal bleeding)   ? PMS (premenstrual syndrome)   ? Sea sickness   ? Uterine fibroid   ? Vitamin D deficiency   ?  ? ?Past Surgical History:  ?Procedure Laterality Date  ? DILATION AND EVACUATION    ?  RIGHT/LEFT HEART CATH AND CORONARY ANGIOGRAPHY N/A 02/22/2017  ? Procedure: Right/Left Heart Cath and Coronary Angiography;  Surgeon: Burnell Blanks, MD;  Location: [facility];  Service: Cardiovascular;  Laterality: N/A;  ? TUBAL LIGATION    ? UTERINE ARTERY EMBOLIZATION    ? ? ?Family History  ?Problem Relation Age of Onset  ? Colon polyps Father   ? Diabetes Mellitus I Father   ? Lung cancer Maternal Grandmother   ? Hypertension Paternal Grandmother   ? Prostate cancer Paternal Grandfather   ? Colon cancer Neg Hx   ? Ovarian cancer Neg Hx   ? Breast cancer Neg Hx   ? Thyroid disease Neg Hx   ? ? ?Social History  ? ?Tobacco Use  ? Smoking status: Former  ?  Types: Cigarettes  ?  Quit date: 01/12/1994  ?  Years since quitting: 28.0  ? Smokeless tobacco: Never  ?Vaping Use  ? Vaping Use: Never used  ?Substance Use Topics  ? Alcohol use: No  ? Drug use: No  ? ? ?ROS ? ? ?Objective:  ? ?Vitals: ?BP 129/85 (BP Location: Right Arm)   Pulse 78   Temp 98.1 ?F (36.7 ?C) (Oral)   Resp 18   LMP 04/14/2011   SpO2 98%  ? ?Physical Exam ?Constitutional:   ?   General: She is not  in acute distress. ?   Appearance: Normal appearance. She is well-developed. She is not ill-appearing, toxic-appearing or diaphoretic.  ?HENT:  ?   Head: Normocephalic and atraumatic.  ?   Nose: Nose normal.  ?   Mouth/Throat:  ?   Mouth: Mucous membranes are moist.  ?Eyes:  ?   General: No scleral icterus.    ?   Right eye: No discharge.     ?   Left eye: No discharge.  ?   Extraocular Movements: Extraocular movements intact.  ?Cardiovascular:  ?   Rate and Rhythm: Normal rate.  ?Pulmonary:  ?   Effort: Pulmonary effort is normal.  ?Musculoskeletal:  ?   Right knee: Bony tenderness present. No swelling, deformity, effusion, erythema, ecchymosis, lacerations or crepitus. Normal range of motion. Tenderness present over the patellar tendon. No medial joint line or lateral joint line tenderness. Normal alignment, normal meniscus and  normal patellar mobility.  ?   Left ankle: Swelling present. No deformity, ecchymosis or lacerations. Tenderness present over the lateral malleolus. No medial malleolus, ATF ligament, AITF ligament, CF ligament, posterior TF ligament, base of 5th metatarsal or proximal fibula tenderness. Decreased range of motion.  ?   Left Achilles Tendon: No tenderness or defects. Thompson's test negative.  ?Skin: ?   General: Skin is warm and dry.  ?Neurological:  ?   General: No focal deficit present.  ?   Mental Status: She is alert and oriented to [place], place, and time.  ?Psychiatric:     ?   Mood and Affect: Mood normal.     ?   Behavior: Behavior normal.  ? ? ?DG Ankle Complete Left ? ?Result Date: 01/05/2022 ?CLINICAL DATA:  Left ankle pain after fall yesterday EXAM: LEFT ANKLE COMPLETE - 3+ VIEW COMPARISON:  None Available. FINDINGS: There is no evidence of fracture, dislocation, or joint effusion. There is no evidence of arthropathy or other focal bone abnormality. Lateral soft tissue swelling is noted. IMPRESSION: No fracture or dislocation is noted. Lateral soft tissue swelling is noted. Electronically Signed   By: Marijo Conception M.D.   On: 01/05/2022 10:56   ? ?Two separate 4 inch Ace wraps was applied individually to the right knee and also the left ankle. ? ?Assessment and Plan :  ? ?PDMP not reviewed this encounter. ? ?1. Acute left ankle pain   ?2. Acute pain of right knee   ?3. Non-ischemic cardiomyopathy ([place])   ? ?Recommended conservative management for musculoskeletal pain related to fall.  Use general RICE method.  Naproxen for pain and inflammation. Counseled patient on potential for adverse effects with medications prescribed/recommended today, ER and return-to-clinic precautions discussed, patient verbalized understanding. ? ?  ?Jaynee Eagles, PA-C ?01/05/22 1115 ? ?

## 2022-01-05 NOTE — Telephone Encounter (Addendum)
Pt advised Dr Harrington Challenger' recommendations for the new meds and she verbalized understanding.. she will let us know if she has any problems affording the Jardiance.  ? ?Labs 01/24/22 ?

## 2022-01-05 NOTE — Addendum Note (Signed)
Addended by: Stephani Police on: 01/05/2022 12:30 PM ? ? Modules accepted: Orders ? ?

## 2022-01-20 ENCOUNTER — Other Ambulatory Visit: Payer: Self-pay | Admitting: Obstetrics and Gynecology

## 2022-01-20 DIAGNOSIS — Z1231 Encounter for screening mammogram for malignant neoplasm of breast: Secondary | ICD-10-CM

## 2022-01-24 ENCOUNTER — Other Ambulatory Visit: Payer: BC Managed Care – PPO

## 2022-01-24 DIAGNOSIS — I5022 Chronic systolic (congestive) heart failure: Secondary | ICD-10-CM

## 2022-01-24 DIAGNOSIS — I428 Other cardiomyopathies: Secondary | ICD-10-CM

## 2022-01-24 DIAGNOSIS — Z79899 Other long term (current) drug therapy: Secondary | ICD-10-CM

## 2022-01-25 ENCOUNTER — Other Ambulatory Visit: Payer: Self-pay | Admitting: Obstetrics and Gynecology

## 2022-01-25 DIAGNOSIS — N644 Mastodynia: Secondary | ICD-10-CM

## 2022-01-25 LAB — BASIC METABOLIC PANEL
BUN/Creatinine Ratio: 9 (ref 9–23)
BUN: 10 mg/dL (ref 6–24)
CO2: 22 mmol/L (ref 20–29)
Calcium: 8.9 mg/dL (ref 8.7–10.2)
Chloride: 103 mmol/L (ref 96–106)
Creatinine, Ser: 1.13 mg/dL — ABNORMAL HIGH (ref 0.57–1.00)
Glucose: 94 mg/dL (ref 70–99)
Potassium: 3.9 mmol/L (ref 3.5–5.2)
Sodium: 139 mmol/L (ref 134–144)
eGFR: 58 mL/min/{1.73_m2} — ABNORMAL LOW (ref >59)

## 2022-01-26 ENCOUNTER — Telehealth: Payer: Self-pay

## 2022-01-26 DIAGNOSIS — I428 Other cardiomyopathies: Secondary | ICD-10-CM

## 2022-01-26 DIAGNOSIS — I5022 Chronic systolic (congestive) heart failure: Secondary | ICD-10-CM

## 2022-01-26 NOTE — Telephone Encounter (Signed)
The patient has been notified of the result and verbalized understanding.  All questions (if any) were answered. Antonieta Iba, RN 01/26/2022 3:34 PM  Patient has not been taking any NSAIDs.  Repeat labs have been scheduled.

## 2022-01-26 NOTE — Telephone Encounter (Signed)
-----   Message from [name], MD sent at 01/26/2022 10:20 AM EDT ----- Cr is up slightly.   May be coincidental Note that pt had hurt ankle recently   ? If taking NSAIDS   This could have affected I  would repeat BMET and check BNP in a couple weeks.   Need to be off of NSAI

## 2022-02-09 ENCOUNTER — Ambulatory Visit
Admission: RE | Admit: 2022-02-09 | Discharge: 2022-02-09 | Disposition: A | Discharge status: Home / Self Care | Payer: BC Managed Care – PPO | Source: Ambulatory Visit | Attending: Obstetrics and Gynecology | Admitting: Obstetrics and Gynecology

## 2022-02-09 ENCOUNTER — Ambulatory Visit: Payer: BC Managed Care – PPO

## 2022-02-09 DIAGNOSIS — N644 Mastodynia: Secondary | ICD-10-CM

## 2022-02-15 ENCOUNTER — Other Ambulatory Visit: Payer: BC Managed Care – PPO

## 2022-02-15 DIAGNOSIS — I5022 Chronic systolic (congestive) heart failure: Secondary | ICD-10-CM

## 2022-02-15 DIAGNOSIS — I428 Other cardiomyopathies: Secondary | ICD-10-CM

## 2022-02-16 LAB — BASIC METABOLIC PANEL
BUN/Creatinine Ratio: 10 (ref 9–23)
BUN: 9 mg/dL (ref 6–24)
CO2: 22 mmol/L (ref 20–29)
Calcium: 9.3 mg/dL (ref 8.7–10.2)
Chloride: 104 mmol/L (ref 96–106)
Creatinine, Ser: 0.9 mg/dL (ref 0.57–1.00)
Glucose: 100 mg/dL — ABNORMAL HIGH (ref 70–99)
Potassium: 4.5 mmol/L (ref 3.5–5.2)
Sodium: 140 mmol/L (ref 134–144)
eGFR: 76 mL/min/{1.73_m2} (ref >59)

## 2022-02-16 LAB — PRO B NATRIURETIC PEPTIDE: NT-Pro BNP: 84 pg/mL (ref 0–249)

## 2022-02-28 ENCOUNTER — Telehealth: Payer: Self-pay | Admitting: Internal Medicine

## 2022-03-01 MED ORDER — FLUCONAZOLE 150 MG PO TABS: 150.0000 mg | ORAL_TABLET | Freq: Once | ORAL | 0 refills | Status: AC

## 2022-03-01 NOTE — Telephone Encounter (Signed)
Called pt advised of MD recommendation: Stop Jardiance   She has had 2 yeast infections in the 2 months she started     Diflucan 150 x 1     ALso recomm miconazole cream suppositories   Pt verbalizes understanding.  Would like to know if MD will start another medication in place of Jardiance.  Will route to MD to address.

## 2022-03-01 NOTE — Telephone Encounter (Signed)
Don't plan for now on starting another med instead of jardiance

## 2022-03-02 NOTE — Telephone Encounter (Signed)
Left a message for the pt to call back.  

## 2022-03-10 NOTE — Telephone Encounter (Signed)
  Pt is returning call. She go to work at 6 pm, she said, to call her before 6 pm today

## 2022-03-10 NOTE — Telephone Encounter (Signed)
Called pt advised of MD response:  "Don't plan for now on starting another med instead of jardiance."  Pt had no further questions or concerns.

## 2022-06-22 NOTE — Progress Notes (Unsigned)
Cardiology Office Note   Date:  06/23/2022   ID:  Sharon Mccall, DOB 1966/08/27, MRN [id number]  PCP:  Servando Salina, MD  Cardiologist:   Dorris Carnes, MD    Pt presents for f/u of NICM     History of Present Illness: Sharon Mccall is a 56 y.o. female with a history of NICM   LVEF 25 to 30%   Cardiac cath in June 2018 showed normal coronaries.  Repeat echo in Dec 2018 showed    LVEF 35 to 40%.   I saw the pt in Aug 2021   Since seen she has been seen by Beckie Salts, felt no indication for device.   At the last visit with Elwyn Reach  in Dec 2022 she complained of "itching" sensation in chest   Occurred at rest    Noted no change in breathing  She was set up for an echocardiogram    LVEF unchanged at 35 to 40%  Since she was last seen she says her breathing is about the same  Still gets SOB with activity     She denies CP   She denies LE edema   Activity is some walking    Pt was last in clinic in April 2023   I recomm Jardiance and spironolactone   She could not tolerate Jardiance due to a yeast infection.     She stopped spironolactone because she though she urinated more     Since seen her breathing is stable  She is OK on level ground  She gets SOB with stairs   Current Meds  Medication Sig   Aspirin-Acetaminophen-Caffeine (EXCEDRIN MIGRAINE PO) Take 1 tablet by mouth daily as needed (headache).    carvedilol (COREG) 3.125 MG tablet Take 1 tablet (3.125 mg total) by mouth 2 (two) times daily.   esomeprazole (NEXIUM) 20 MG capsule Take 20 mg by mouth daily. As needed   rosuvastatin (CRESTOR) 10 MG tablet Take 1 tablet (10 mg total) by mouth daily.   sacubitril-valsartan (ENTRESTO) 49-51 MG Take 1 tablet by mouth 2 (two) times daily.     Allergies:   Other, Phenergan [promethazine hcl], and Wellbutrin [bupropion]   Past Medical History:  Diagnosis Date   Body mass index (bmi) 26.0-26.9, adult    Cervical high risk HPV (human papillomavirus) test positive     Depression    Elevated fasting lipid profile    GERD (gastroesophageal reflux disease)    Hot flashes    Hx of colonoscopy    Migraines    PMB (postmenopausal bleeding)    PMS (premenstrual syndrome)    Sea sickness    Uterine fibroid    Vitamin D deficiency     Past Surgical History:  Procedure Laterality Date   DILATION AND EVACUATION     RIGHT/LEFT HEART CATH AND CORONARY ANGIOGRAPHY N/A 02/22/2017   Procedure: Right/Left Heart Cath and Coronary Angiography;  Surgeon: Burnell Blanks, MD;  Location: [facility];  Service: Cardiovascular;  Laterality: N/A;   TUBAL LIGATION     UTERINE ARTERY EMBOLIZATION       Social History:  The patient  reports that she quit smoking about 28 years ago. Her smoking use included cigarettes. She has never used smokeless tobacco. She reports that she does not drink alcohol and does not use drugs.   Family History:  The patient's family history includes Colon polyps in her father; Diabetes Mellitus I in her father; Hypertension in her paternal  grandmother; Lung cancer in her maternal grandmother; Prostate cancer in her paternal grandfather.    ROS:  Please see the history of present illness. All other systems are reviewed and  Negative to the above problem except as noted.    PHYSICAL EXAM: VS:  BP 120/80   Pulse 72   Ht '5\' 2"'$  (1.575 m)   Wt 151 lb (68.5 kg)   LMP 04/14/2011   SpO2 97%   BMI 27.62 kg/m   GEN: Overweight 56 yo in no acute distress  HEENT: normal  Neck: JVP is normal  Cardiac: RRR; no murmurs;  No LE edema Respiratory:  clear to auscultation  GI: soft, nontender, nondistended, + BS  No hepatomegaly  MS: no deformity Moving all extremities   Skin: warm and dry, no rash Neuro:  Strength and sensation are intact Psych: euthymic mood, full affect   EKG:  EKG is not ordered today.    Lipid Panel    Component Value Date/Time   CHOL 137 08/13/2021 1101   TRIG 157 (H) 08/13/2021 1101   HDL 39 (L)  08/13/2021 1101   CHOLHDL 3.5 08/13/2021 1101   LDLCALC 71 08/13/2021 1101      Wt Readings from Last 3 Encounters:  06/23/22 151 lb (68.5 kg)  12/23/21 148 lb 6.4 oz (67.3 kg)  10/14/21 149 lb (67.6 kg)      ASSESSMENT AND PLAN:  1  NICM   Cath in 2018 showed no CAD Echo in Dec 2022 showed LVEF 35 to 40%    Volume OK on exam  Did not toleate Jardiance    Would try spironolactone again  Follow up BMET in 10 days     Stay active   Follow up in clinic in 6 months.      Current medicines are reviewed at length with the patient today.  The patient does not have concerns regarding medicines.  Signed, Dorris Carnes, MD  06/23/2022 10:43 AM    [address], [place], [place]  [address] Phone: [phone]; Fax: [phone]

## 2022-06-23 ENCOUNTER — Encounter: Payer: Self-pay | Admitting: Internal Medicine

## 2022-06-23 ENCOUNTER — Ambulatory Visit: Payer: BC Managed Care – PPO | Attending: Internal Medicine | Admitting: Internal Medicine

## 2022-06-23 VITALS — BP 120/80 | HR 72 | Ht 62.0 in | Wt 151.0 lb

## 2022-06-23 DIAGNOSIS — I5022 Chronic systolic (congestive) heart failure: Secondary | ICD-10-CM | POA: Diagnosis not present

## 2022-06-23 NOTE — Patient Instructions (Signed)
Medication Instructions:  Decrease Spironolactone to 12.5 mg daily  *If you need a refill on your cardiac medications before your next appointment, please call your pharmacy*   Lab Work: BMET IN 2 WEEKS   If you have labs (blood work) drawn today and your tests are completely normal, you will receive your results only by: [place] (if you have MyChart) OR A paper copy in the mail If you have any lab test that is abnormal or we need to change your treatment, we will call you to review the results.   Testing/Procedures:    Follow-Up: At [facility], you and your health needs are our priority.  As part of our continuing mission to provide you with exceptional heart care, we have created designated Provider Care Teams.  These Care Teams include your primary Cardiologist (physician) and Advanced Practice Providers (APPs -  Physician Assistants and Nurse Practitioners) who all work together to provide you with the care you need, when you need it.  We recommend signing up for the patient portal called "MyChart".  Sign up information is provided on this After Visit Summary.  MyChart is used to connect with patients for Virtual Visits (Telemedicine).  Patients are able to view lab/test results, encounter notes, upcoming appointments, etc.  Non-urgent messages can be sent to your provider as well.   To learn more about what you can do with MyChart, go to [email or web address].    Your next appointment:   6 month(s)  The format for your next appointment:   In [place]  Provider:   Dorris Carnes, MD     Other Instructions   Important Information About Sugar

## 2022-07-07 ENCOUNTER — Ambulatory Visit: Payer: BC Managed Care – PPO | Attending: Internal Medicine

## 2022-07-07 DIAGNOSIS — I5022 Chronic systolic (congestive) heart failure: Secondary | ICD-10-CM

## 2022-07-08 LAB — BASIC METABOLIC PANEL
BUN/Creatinine Ratio: 11 (ref 9–23)
BUN: 11 mg/dL (ref 6–24)
CO2: 24 mmol/L (ref 20–29)
Calcium: 9.5 mg/dL (ref 8.7–10.2)
Chloride: 104 mmol/L (ref 96–106)
Creatinine, Ser: 1 mg/dL (ref 0.57–1.00)
Glucose: 117 mg/dL — ABNORMAL HIGH (ref 70–99)
Potassium: 4.4 mmol/L (ref 3.5–5.2)
Sodium: 138 mmol/L (ref 134–144)
eGFR: 67 mL/min/{1.73_m2} (ref >59)

## 2022-08-03 ENCOUNTER — Other Ambulatory Visit: Payer: Self-pay | Admitting: Internal Medicine

## 2022-08-09 ENCOUNTER — Ambulatory Visit
Admission: EM | Admit: 2022-08-09 | Discharge: 2022-08-09 | Disposition: A | Discharge status: Home / Self Care | Payer: BC Managed Care – PPO | Attending: Urgent Care | Admitting: Urgent Care

## 2022-08-09 ENCOUNTER — Telehealth (HOSPITAL_COMMUNITY): Payer: Self-pay | Admitting: Emergency Medicine

## 2022-08-09 DIAGNOSIS — R52 Pain, unspecified: Secondary | ICD-10-CM

## 2022-08-09 DIAGNOSIS — B349 Viral infection, unspecified: Secondary | ICD-10-CM | POA: Diagnosis present

## 2022-08-09 DIAGNOSIS — Z79899 Other long term (current) drug therapy: Secondary | ICD-10-CM | POA: Diagnosis not present

## 2022-08-09 DIAGNOSIS — I5042 Chronic combined systolic (congestive) and diastolic (congestive) heart failure: Secondary | ICD-10-CM

## 2022-08-09 DIAGNOSIS — U071 COVID-19: Secondary | ICD-10-CM | POA: Insufficient documentation

## 2022-08-09 LAB — RESP PANEL BY RT-PCR (FLU A&B, COVID) ARPGX2
Influenza A by PCR: NEGATIVE
Influenza B by PCR: NEGATIVE
SARS Coronavirus 2 by RT PCR: POSITIVE — AB

## 2022-08-09 MED ORDER — CETIRIZINE HCL 10 MG PO TABS: 10.0000 mg | ORAL_TABLET | Freq: Every day | ORAL | 0 refills | Status: DC

## 2022-08-09 MED ORDER — BENZONATATE 100 MG PO CAPS: 100.0000 mg | ORAL_CAPSULE | Freq: Three times a day (TID) | ORAL | 0 refills | Status: DC | PRN

## 2022-08-09 MED ORDER — NIRMATRELVIR/RITONAVIR (PAXLOVID)TABLET: 3.0000 | ORAL_TABLET | Freq: Two times a day (BID) | ORAL | 0 refills | Status: AC

## 2022-08-09 MED ORDER — PSEUDOEPHEDRINE HCL 30 MG PO TABS: 30.0000 mg | ORAL_TABLET | Freq: Three times a day (TID) | ORAL | 0 refills | Status: DC | PRN

## 2022-08-09 MED ORDER — OSELTAMIVIR PHOSPHATE 75 MG PO CAPS: 75.0000 mg | ORAL_CAPSULE | Freq: Two times a day (BID) | ORAL | 0 refills | Status: DC

## 2022-08-09 MED ORDER — ONDANSETRON 8 MG PO TBDP: 8.0000 mg | ORAL_TABLET | Freq: Three times a day (TID) | ORAL | 0 refills | Status: DC | PRN

## 2022-08-09 NOTE — Discharge Instructions (Signed)
We will notify you of your test results as they arrive and may take between about 24 hours.  I encourage you to sign up for MyChart if you have not already done so as this can be the easiest way for [place] to communicate results to you online or through a phone app.  Generally, we only contact you if it is a positive test result.  In the meantime, if you develop worsening symptoms including fever, chest pain, shortness of breath despite our current treatment plan then please report to the emergency room as this may be a sign of worsening status from possible viral infection.  Otherwise, we will manage this as a viral syndrome like influenza and have you start Tamiflu. Depending on your results from tomorrow we will update the treatment plan. For sore throat or cough try using a honey-based tea. Use 3 teaspoons of honey with juice squeezed from half lemon. Place shaved pieces of ginger into 1/2-1 cup of water and warm over stove top. Then mix the ingredients and repeat every 4 hours as needed. Please take Tylenol '500mg'$ -'650mg'$  every 6 hours for aches and pains, fevers. Hydrate very well with at least 2 liters of water. Eat light meals such as soups to replenish electrolytes and soft fruits, veggies. Start an antihistamine like Zyrtec for postnasal drainage, sinus congestion.  Use the cough medications as needed.

## 2022-08-09 NOTE — ED Provider Notes (Signed)
[name]  Note:  This document was prepared using Systems analyst and may include unintentional dictation errors.  MRN: [id number] DOB: November 26, 1965  Subjective:   Sharon Mccall is a 56 y.o. female presenting for 3 day history of acute onset body aches, fever, nausea, coughing. No chest pain, shob, wheezing, abdominal pain, diarrhea, constipation. Has a history of CHF. Last echo showed EF 35%-40% in 2022. No history of asthma. No smoking, vaping, drug use.   No current facility-administered medications for this encounter.  Current Outpatient Medications:    Aspirin-Acetaminophen-Caffeine (EXCEDRIN MIGRAINE PO), Take 1 tablet by mouth daily as needed (headache). , Disp: , Rfl:    carvedilol (COREG) 3.125 MG tablet, Take 1 tablet (3.125 mg total) by mouth 2 (two) times daily., Disp: 180 tablet, Rfl: 1   esomeprazole (NEXIUM) 20 MG capsule, Take 20 mg by mouth daily. As needed, Disp: , Rfl:    ibuprofen (ADVIL) 400 MG tablet, Take 1 tablet (400 mg total) by mouth every 6 (six) hours as needed. (Patient not taking: Reported on 06/23/2022), Disp: 16 tablet, Rfl: 0   naproxen (NAPROSYN) 375 MG tablet, Take 1 tablet (375 mg total) by mouth 2 (two) times daily with a meal. (Patient not taking: Reported on 06/23/2022), Disp: 30 tablet, Rfl: 0   rosuvastatin (CRESTOR) 10 MG tablet, Take 1 tablet (10 mg total) by mouth daily., Disp: 90 tablet, Rfl: 3   sacubitril-valsartan (ENTRESTO) 49-51 MG, Take 1 tablet by mouth twice daily, Disp: 180 tablet, Rfl: 1   spironolactone (ALDACTONE) 25 MG tablet, Take 0.5 tablets (12.5 mg total) by mouth daily. (Patient not taking: Reported on 06/23/2022), Disp: 45 tablet, Rfl: 3   Allergies  Allergen Reactions   Other Other (See Comments)   Phenergan [Promethazine Hcl] Other (See Comments)    shake   Wellbutrin [Bupropion] Hives    Past Medical History:  Diagnosis Date   Body mass index (bmi) 26.0-26.9, adult     Cervical high risk HPV (human papillomavirus) test positive    Depression    Elevated fasting lipid profile    GERD (gastroesophageal reflux disease)    Hot flashes    Hx of colonoscopy    Migraines    PMB (postmenopausal bleeding)    PMS (premenstrual syndrome)    Sea sickness    Uterine fibroid    Vitamin D deficiency      Past Surgical History:  Procedure Laterality Date   DILATION AND EVACUATION     RIGHT/LEFT HEART CATH AND CORONARY ANGIOGRAPHY N/A 02/22/2017   Procedure: Right/Left Heart Cath and Coronary Angiography;  Surgeon: Burnell Blanks, MD;  Location: [facility];  Service: Cardiovascular;  Laterality: N/A;   TUBAL LIGATION     UTERINE ARTERY EMBOLIZATION      Family History  Problem Relation Age of Onset   Colon polyps Father    Diabetes Mellitus I Father    Lung cancer Maternal Grandmother    Hypertension Paternal Grandmother    Prostate cancer Paternal Grandfather    Colon cancer Neg Hx    Ovarian cancer Neg Hx    Breast cancer Neg Hx    Thyroid disease Neg Hx     Social History   Tobacco Use   Smoking status: Former    Types: Cigarettes    Quit date: 01/12/1994    Years since quitting: 28.5   Smokeless tobacco: Never  Vaping Use   Vaping Use: Never used  Substance  Use Topics   Alcohol use: No   Drug use: No    ROS   Objective:   Vitals: BP 97/72 (BP Location: Right Arm)   Pulse 83   Temp 99.6 F (37.6 C) (Oral)   Resp 20   LMP 04/14/2011   SpO2 95%   Physical Exam Constitutional:      General: She is not in acute distress.    Appearance: Normal appearance. She is well-developed and normal weight. She is not ill-appearing, toxic-appearing or diaphoretic.  HENT:     Head: Normocephalic and atraumatic.     Right Ear: Tympanic membrane, ear canal and external ear normal. No drainage or tenderness. No middle ear effusion. There is no impacted cerumen. Tympanic membrane is not erythematous or bulging.     Left Ear:  Tympanic membrane, ear canal and external ear normal. No drainage or tenderness.  No middle ear effusion. There is no impacted cerumen. Tympanic membrane is not erythematous or bulging.     Nose: Congestion present. No rhinorrhea.     Mouth/Throat:     Mouth: Mucous membranes are moist. No oral lesions.     Pharynx: No pharyngeal swelling, oropharyngeal exudate, posterior oropharyngeal erythema or uvula swelling.     Tonsils: No tonsillar exudate or tonsillar abscesses.  Eyes:     General: No scleral icterus.       Right eye: No discharge.        Left eye: No discharge.     Extraocular Movements: Extraocular movements intact.     Right eye: Normal extraocular motion.     Left eye: Normal extraocular motion.     Conjunctiva/sclera: Conjunctivae normal.  Cardiovascular:     Rate and Rhythm: Normal rate and regular rhythm.     Heart sounds: Normal heart sounds. No murmur heard.    No friction rub. No gallop.  Pulmonary:     Effort: Pulmonary effort is normal. No respiratory distress.     Breath sounds: No stridor. No wheezing, rhonchi or rales.  Chest:     Chest wall: No tenderness.  Musculoskeletal:     Cervical back: Normal range of motion and neck supple.  Lymphadenopathy:     Cervical: No cervical adenopathy.  Skin:    General: Skin is warm and dry.  Neurological:     General: No focal deficit present.     Mental Status: She is alert and oriented to [place], place, and time.  Psychiatric:        Mood and Affect: Mood normal.        Behavior: Behavior normal.     Assessment and Plan :   PDMP not reviewed this encounter.  1. Acute viral syndrome   2. Body aches   3. Chronic combined systolic and diastolic congestive heart failure ([place])     Will have patient start empiric treatment for influenza with Tamiflu.  Testing is pending.  We will update treatment plan as needed.  Patient definitely needs to be on Paxlovid should she test positive for COVID-19.  She would stop the  Tamiflu in that case. Deferred imaging given clear cardiopulmonary exam, hemodynamically stable vital signs.  Patient would have to hold Crestor while on Paxlovid if it comes to that.  You supportive care otherwise for an acute viral syndrome.  Counseled patient on potential for adverse effects with medications prescribed/recommended today, ER and return-to-clinic precautions discussed, patient verbalized understanding.    Jaynee Eagles, [place] 08/09/22 [phone]

## 2022-08-09 NOTE — ED Triage Notes (Signed)
Pt c/o cough, body aches, fever x 3 days-NAD-steady gait

## 2022-11-07 ENCOUNTER — Telehealth: Payer: Self-pay | Admitting: Internal Medicine

## 2022-11-07 NOTE — Telephone Encounter (Signed)
**Note De-Identified Gevork Ayyad Obfuscation** The pt is aware that I am working on her [place]. She did provide me the BIN: [id number], PCN: [id number], and GRP RX: [id number] info from her card as well.

## 2022-11-07 NOTE — Telephone Encounter (Signed)
Pt c/o medication issue:  1. Name of Medication: sacubitril-valsartan (ENTRESTO) 49-51 MG   2. How are you currently taking this medication (dosage and times per day)?   Take 1 tablet by mouth twice daily    3. Are you having a reaction (difficulty breathing--STAT)? No  4. What is your medication issue? Pt would like a callback regarding what she thinks is Prior Auth for medication. Please advise

## 2022-11-08 NOTE — Telephone Encounter (Signed)
**Note De-Identified Sharon Mccall Obfuscation** I started a Entresto PA through covermymeds. Key: [id number]

## 2022-11-08 NOTE — Telephone Encounter (Signed)
**Note De-Identified Yahye Siebert Obfuscation** Dear Elmyra Ricks Egler: [facility]  received a request from your provider for coverage of Entresto (sacubitril-valsartan). As long as you remain covered by the [facility] and there are no changes to your plan benefits, this request is approved for the following time period: 11/08/2022 - 11/08/2023  [place] ([place]), [place] [facility] [place] (Ph: [phone]) is aware of this approval and I did request that they fill and to notify the pt when ready for pick up.

## 2022-11-15 ENCOUNTER — Telehealth: Payer: Self-pay | Admitting: Internal Medicine

## 2022-11-15 NOTE — Telephone Encounter (Signed)
She has Pharmacist, community, should be using the $10/month copay card. If she hasn't been set up with one, should have some in the back sample room.

## 2022-11-15 NOTE — Telephone Encounter (Signed)
Pt c/o medication issue:  1. Name of Medication: sacubitril-valsartan (ENTRESTO) 49-51 MG   2. How are you currently taking this medication (dosage and times per day)?   Take 1 tablet by mouth twice daily    3. Are you having a reaction (difficulty breathing--STAT)? no  4. What is your medication issue? Calling in regards to the prescription. She states the cost has went up one it, calling to see what other options they are. Please advise

## 2022-11-15 NOTE — Telephone Encounter (Signed)
Spoke to the patient, she lost her co-pay card. We will give her another co-pay card and it will be located in the front office. Patient voiced understanding.

## 2023-01-31 ENCOUNTER — Other Ambulatory Visit: Payer: Self-pay | Admitting: Internal Medicine

## 2023-02-01 ENCOUNTER — Other Ambulatory Visit: Payer: Self-pay

## 2023-02-01 MED ORDER — ROSUVASTATIN CALCIUM 10 MG PO TABS: 10.0000 mg | ORAL_TABLET | Freq: Every day | ORAL | 1 refills | Status: DC

## 2023-02-01 NOTE — Telephone Encounter (Signed)
Pt's medication was sent to pt's pharmacy as requested. Confirmation received.  °

## 2023-02-09 ENCOUNTER — Other Ambulatory Visit: Payer: Self-pay | Admitting: Internal Medicine

## 2023-03-24 ENCOUNTER — Other Ambulatory Visit: Payer: Self-pay | Admitting: Internal Medicine

## 2023-03-24 MED ORDER — ENTRESTO 49-51 MG PO TABS: 1.0000 | ORAL_TABLET | Freq: Two times a day (BID) | ORAL | 0 refills | Status: DC

## 2023-07-09 ENCOUNTER — Telehealth: Payer: Self-pay | Admitting: Internal Medicine

## 2023-07-11 MED ORDER — ENTRESTO 49-51 MG PO TABS: 1.0000 | ORAL_TABLET | Freq: Two times a day (BID) | ORAL | 0 refills | Status: DC

## 2023-07-11 NOTE — Telephone Encounter (Signed)
Patient is requesting updates on her refill request. She would like a call back.

## 2023-07-25 ENCOUNTER — Other Ambulatory Visit: Payer: Self-pay | Admitting: Internal Medicine

## 2023-09-28 ENCOUNTER — Telehealth: Payer: Self-pay | Admitting: Internal Medicine

## 2023-09-28 NOTE — Telephone Encounter (Signed)
Pt is requesting a callback regarding her wanting to know if she'll be doing blood work at her appt to check cholesterol and liver function. Please advise

## 2023-09-29 NOTE — Telephone Encounter (Signed)
Set up for a lipomed panel, CBC, CMET, TSH, Hgb A1C and Vit D

## 2023-09-29 NOTE — Progress Notes (Unsigned)
Cardiology Office Note   Date:  10/02/2023   ID:  Sharon Mccall, DOB [birth year], MRN [id number]  PCP:  Maxie Better, MD  Cardiologist:   Dietrich Pates, MD    Pt presents for f/u of NICM     History of Present Illness: Sharon Mccall is a 58 y.o. female with a history of NICM   LVEF 25 to 30%   Cardiac cath in June 2018 showed normal coronaries.  Repeat echo in Dec 2018 showed    LVEF 35 to 40%. Seen by EP in 2021  No indication for device    Echo 2022 LVEF 35 to 40%    Did not tolerate Jardiance due to yeast infection    Was on spironolactone   Not on now   ? Reason  She says breathing is stable   Will get SOB with stairs    Is on feet walking all day Denies CP   No palpitations   No dizziness  Some LE edema   Wears support socks Has numbness in hands and feet when sleeping      Had migraine last night   Still with HA  Took excedrin Migraine  Work:  Buyer, retail in [place]     Breakfast:   10;30  Spinach/green and black rice   Chicken   Water and Agave Dinner   4:30   About the same     Current Meds  Medication Sig   Aspirin-Acetaminophen-Caffeine (EXCEDRIN MIGRAINE PO) Take 1 tablet by mouth daily as needed (headache).    carvedilol (COREG) 3.125 MG tablet Take 1 tablet by mouth twice daily   esomeprazole (NEXIUM) 20 MG capsule Take 20 mg by mouth daily. As needed   ibuprofen (ADVIL) 400 MG tablet Take 1 tablet (400 mg total) by mouth every 6 (six) hours as needed.   rosuvastatin (CRESTOR) 10 MG tablet Take 1 tablet by mouth once daily   sacubitril-valsartan (ENTRESTO) 49-51 MG Take 1 tablet by mouth 2 (two) times daily.     Allergies:   Other, Phenergan [promethazine hcl], and Wellbutrin [bupropion]   Past Medical History:  Diagnosis Date   Body mass index (bmi) 26.0-26.9, adult    Cervical high risk HPV (human papillomavirus) test positive    Depression    Elevated fasting lipid profile    GERD (gastroesophageal reflux disease)    Hot  flashes    Hx of colonoscopy    Migraines    PMB (postmenopausal bleeding)    PMS (premenstrual syndrome)    Sea sickness    Uterine fibroid    Vitamin D deficiency     Past Surgical History:  Procedure Laterality Date   DILATION AND EVACUATION     RIGHT/LEFT HEART CATH AND CORONARY ANGIOGRAPHY N/A 02/22/2017   Procedure: Right/Left Heart Cath and Coronary Angiography;  Surgeon: Kathleene Hazel, MD;  Location: [facility];  Service: Cardiovascular;  Laterality: N/A;   TUBAL LIGATION     UTERINE ARTERY EMBOLIZATION       Social History:  The patient  reports that she quit smoking about 29 years ago. Her smoking use included cigarettes. She has never used smokeless tobacco. She reports that she does not drink alcohol and does not use drugs.   Family History:  The patient's family history includes Colon polyps in her father; Diabetes Mellitus I in her father; Hypertension in her paternal grandmother; Lung cancer in her maternal grandmother; Prostate cancer in her paternal grandfather.  ROS:  Please see the history of present illness. All other systems are reviewed and  Negative to the above problem except as noted.    PHYSICAL EXAM: VS:  BP (!) 140/92   Pulse 70   Ht 5\' 2"  (1.575 m)   Wt 163 lb 12.8 oz (74.3 kg)   LMP 04/14/2011   SpO2 98%   BMI 29.96 kg/m   GEN: Overweight 58 yo in no acute distress  HEENT: normal  Neck: JVP is normal  Cardiac: RRR; no murmur  No LE edema Respiratory:  clear to auscultation  GI: soft, nontender  No hepatomegaly  MS: no deformity Moving all extremities   Skin: warm and dry, no rash Neuro:  Strength and sensation are intact Psych: euthymic mood, full affect   EKG:  EKG shows SR 70  LBBB     Lipid Panel    Component Value Date/Time   CHOL 137 08/13/2021 1101   TRIG 157 (H) 08/13/2021 1101   HDL 39 (L) 08/13/2021 1101   CHOLHDL 3.5 08/13/2021 1101   LDLCALC 71 08/13/2021 1101      Wt Readings from Last 3  Encounters:  10/02/23 163 lb 12.8 oz (74.3 kg)  06/23/22 151 lb (68.5 kg)  12/23/21 148 lb 6.4 oz (67.3 kg)      ASSESSMENT AND PLAN:  1  NICM   Cath in 2018 showed no CAD Echo in Dec 2022 showed LVEF 35 to 40% Will restart spironolactone Follow up BMET in 10 days  Continue other meds    Echo to reevaluate LV function FOllow BP  Goal 100s to 120s   Call with response to changes   2  Blood pressure   HIgh tody  Still has HA  Took Excedrin Migraine last night   Follow   3  HCM  Check Lipomed, A1C  4  Metabolics  discussed diet     Eliminate SSB    Limit carbs    5  Hand/food numbness  Sounds like may be back problem  Refer to [employer]    Current medicines are reviewed at length with the patient today.  The patient does not have concerns regarding medicines.  Signed, Dietrich Pates, MD  10/02/2023 9:22 AM    [facility] [address] [place], [place], [place]  [address] Phone: [phone]; Fax: [phone]

## 2023-10-02 ENCOUNTER — Encounter: Payer: Self-pay | Admitting: Internal Medicine

## 2023-10-02 ENCOUNTER — Ambulatory Visit: Payer: 59 | Attending: Internal Medicine | Admitting: Internal Medicine

## 2023-10-02 VITALS — BP 140/92 | HR 70 | Ht 62.0 in | Wt 163.8 lb

## 2023-10-02 DIAGNOSIS — R200 Anesthesia of skin: Secondary | ICD-10-CM

## 2023-10-02 DIAGNOSIS — Z79899 Other long term (current) drug therapy: Secondary | ICD-10-CM | POA: Diagnosis not present

## 2023-10-02 DIAGNOSIS — I5022 Chronic systolic (congestive) heart failure: Secondary | ICD-10-CM | POA: Diagnosis not present

## 2023-10-02 DIAGNOSIS — I428 Other cardiomyopathies: Secondary | ICD-10-CM

## 2023-10-02 DIAGNOSIS — R202 Paresthesia of skin: Secondary | ICD-10-CM

## 2023-10-02 LAB — CBC

## 2023-10-02 NOTE — Patient Instructions (Signed)
Medication Instructions:   *If you need a refill on your cardiac medications before your next appointment, please call your pharmacy*   Lab Work: Bmet, cbc, nmr, hgba1c, vit d today  If you have labs (blood work) drawn today and your tests are completely normal, you will receive your results only by: MyChart Message (if you have MyChart) OR A paper copy in the mail If you have any lab test that is abnormal or we need to change your treatment, we will call you to review the results.   Testing/Procedures:  Your physician has requested that you have an echocardiogram. Echocardiography is a painless test that uses sound waves to create images of your heart. It provides your doctor with information about the size and shape of your heart and how well your heart's chambers and valves are working. This procedure takes approximately one hour. There are no restrictions for this procedure. Please do NOT wear cologne, perfume, aftershave, or lotions (deodorant is allowed). Please arrive 15 minutes prior to your appointment time.  Please note: We ask at that you not bring children with you during ultrasound (echo/ vascular) testing. Due to room size and safety concerns, children are not allowed in the ultrasound rooms during exams. Our front office staff cannot provide observation of children in our lobby area while testing is being conducted. An adult accompanying a patient to their appointment will only be allowed in the ultrasound room at the discretion of the ultrasound technician under special circumstances. We apologize for any inconvenience.     Follow-Up: At [facility], you and your health needs are our priority.  As part of our continuing mission to provide you with exceptional heart care, we have created designated Provider Care Teams.  These Care Teams include your primary Cardiologist (physician) and Advanced Practice Providers (APPs -  Physician Assistants and Nurse Practitioners)  who all work together to provide you with the care you need, when you need it.  We recommend signing up for the patient portal called "MyChart".  Sign up information is provided on this After Visit Summary.  MyChart is used to connect with patients for Virtual Visits (Telemedicine).  Patients are able to view lab/test results, encounter notes, upcoming appointments, etc.  Non-urgent messages can be sent to your provider as well.   To learn more about what you can do with MyChart, go to [email or web address].    Your next appointment:  Fall 2025  Referral to Dr Ayesha Mohair   Appt with nurse same day as Echo for BP check

## 2023-10-02 NOTE — Telephone Encounter (Signed)
OV 10/02/23

## 2023-10-03 ENCOUNTER — Encounter: Payer: Self-pay | Admitting: Internal Medicine

## 2023-10-03 LAB — CBC
Hematocrit: 37.5 % (ref 34.0–46.6)
Hemoglobin: 11.9 g/dL (ref 11.1–15.9)
MCH: 25.8 pg — ABNORMAL LOW (ref 26.6–33.0)
MCHC: 31.7 g/dL (ref 31.5–35.7)
MCV: 81 fL (ref 79–97)
Platelets: 332 10*3/uL (ref 150–450)
RBC: 4.62 x10E6/uL (ref 3.77–5.28)
RDW: 13.8 % (ref 11.7–15.4)
WBC: 4.6 10*3/uL (ref 3.4–10.8)

## 2023-10-03 LAB — NMR, LIPOPROFILE
Cholesterol, Total: 177 mg/dL (ref 100–199)
HDL Particle Number: 33.5 umol/L (ref >=30.5)
HDL-C: 44 mg/dL (ref >39)
LDL Particle Number: 1195 nmol/L — ABNORMAL HIGH (ref <1000)
LDL Size: 20.2 nm — ABNORMAL LOW (ref >20.5)
LDL-C (NIH Calc): 103 mg/dL — ABNORMAL HIGH (ref 0–99)
LP-IR Score: 77 — ABNORMAL HIGH (ref <=45)
Small LDL Particle Number: 764 nmol/L — ABNORMAL HIGH (ref <=527)
Triglycerides: 169 mg/dL — ABNORMAL HIGH (ref 0–149)

## 2023-10-03 LAB — BASIC METABOLIC PANEL
BUN/Creatinine Ratio: 13 (ref 9–23)
BUN: 12 mg/dL (ref 6–24)
CO2: 24 mmol/L (ref 20–29)
Calcium: 9.5 mg/dL (ref 8.7–10.2)
Chloride: 104 mmol/L (ref 96–106)
Creatinine, Ser: 0.89 mg/dL (ref 0.57–1.00)
Glucose: 104 mg/dL — ABNORMAL HIGH (ref 70–99)
Potassium: 4.7 mmol/L (ref 3.5–5.2)
Sodium: 141 mmol/L (ref 134–144)
eGFR: 76 mL/min/{1.73_m2} (ref >59)

## 2023-10-03 LAB — HEMOGLOBIN A1C
Est. average glucose Bld gHb Est-mCnc: 134 mg/dL
Hgb A1c MFr Bld: 6.3 % — ABNORMAL HIGH (ref 4.8–5.6)

## 2023-10-03 LAB — VITAMIN D 25 HYDROXY (VIT D DEFICIENCY, FRACTURES): Vit D, 25-Hydroxy: 27.2 ng/mL — ABNORMAL LOW (ref 30.0–100.0)

## 2023-10-04 ENCOUNTER — Other Ambulatory Visit: Payer: Self-pay

## 2023-10-04 DIAGNOSIS — I5022 Chronic systolic (congestive) heart failure: Secondary | ICD-10-CM

## 2023-10-04 DIAGNOSIS — E785 Hyperlipidemia, unspecified: Secondary | ICD-10-CM

## 2023-10-04 DIAGNOSIS — I428 Other cardiomyopathies: Secondary | ICD-10-CM

## 2023-10-04 DIAGNOSIS — Z79899 Other long term (current) drug therapy: Secondary | ICD-10-CM

## 2023-10-04 MED ORDER — VITAMIN D3 125 MCG (5000 UT) PO TABS: 5000.0000 [IU] | ORAL_TABLET | Freq: Every day | ORAL | Status: AC

## 2023-10-04 MED ORDER — ROSUVASTATIN CALCIUM 20 MG PO TABS: 20.0000 mg | ORAL_TABLET | Freq: Every day | ORAL | 3 refills | Status: DC

## 2023-10-04 MED ORDER — SPIRONOLACTONE 25 MG PO TABS: 12.5000 mg | ORAL_TABLET | Freq: Every day | ORAL | Status: AC

## 2023-10-23 ENCOUNTER — Ambulatory Visit: Payer: 59 | Attending: Internal Medicine

## 2023-10-23 ENCOUNTER — Other Ambulatory Visit: Payer: Self-pay | Admitting: Internal Medicine

## 2023-10-23 ENCOUNTER — Ambulatory Visit: Payer: 59

## 2023-10-23 DIAGNOSIS — I5022 Chronic systolic (congestive) heart failure: Secondary | ICD-10-CM | POA: Insufficient documentation

## 2023-10-23 DIAGNOSIS — Z79899 Other long term (current) drug therapy: Secondary | ICD-10-CM | POA: Insufficient documentation

## 2023-10-23 DIAGNOSIS — R202 Paresthesia of skin: Secondary | ICD-10-CM | POA: Insufficient documentation

## 2023-10-23 DIAGNOSIS — I428 Other cardiomyopathies: Secondary | ICD-10-CM | POA: Insufficient documentation

## 2023-10-23 DIAGNOSIS — I10 Essential (primary) hypertension: Secondary | ICD-10-CM

## 2023-10-23 DIAGNOSIS — R200 Anesthesia of skin: Secondary | ICD-10-CM | POA: Diagnosis present

## 2023-10-23 LAB — ECHOCARDIOGRAM COMPLETE
Area-P 1/2: 3.3 cm2
P 1/2 time: 590 ms
S' Lateral: 3.8 cm

## 2023-10-23 NOTE — Progress Notes (Signed)
   Nurse Visit   Date of Encounter: 10/23/2023 ID: Sharon Mccall, DOB 1966-07-14, MRN [id number]  PCP:  Maxie Better, MD   [facility] Providers Cardiologist:  Dietrich Pates, MD      Visit Details   VS:  BP 122/78   Pulse 67   Resp 11   LMP 04/14/2011   SpO2 98%  , BMI There is no height or weight on file to calculate BMI.  Wt Readings from Last 3 Encounters:  10/02/23 163 lb 12.8 oz (74.3 kg)  06/23/22 151 lb (68.5 kg)  12/23/21 148 lb 6.4 oz (67.3 kg)     Reason for visit: BP  Performed today: Vitals, EKG, Provider consulted:Dr Dietrich Pates, and Education Changes (medications, testing, etc.) : No changs Length of Visit: 15 minutes  Pt says she feels well her BP at home has been:   119/81 118/70 103/62 113/77 106/77 118/74 116/80 118/72 114/72  She had her Echo today.  Pt says after a long exertional and stressful day she developed left sided chest tightness that lasted for about 30 min... relieved with rest.. she denies SOB, No dizziness... her BP at the time was "good" she says. She will monitor and let us know if this reoccurs or if she develops any further symptoms       Medications Adjustments/Labs and Tests Ordered: No orders of the defined types were placed in this encounter.  No orders of the defined types were placed in this encounter.    Signed, Bertram Millard, RN  10/23/2023 10:55 AM

## 2023-10-23 NOTE — Patient Instructions (Signed)
 Medication Instructions:   *If you need a refill on your cardiac medications before your next appointment, please call your pharmacy*   Lab Work:  If you have labs (blood work) drawn today and your tests are completely normal, you will receive your results only by: MyChart Message (if you have MyChart) OR A paper copy in the mail If you have any lab test that is abnormal or we need to change your treatment, we will call you to review the results.   Testing/Procedures:    Follow-Up: At [facility], you and your health needs are our priority.  As part of our continuing mission to provide you with exceptional heart care, we have created designated Provider Care Teams.  These Care Teams include your primary Cardiologist (physician) and Advanced Practice Providers (APPs -  Physician Assistants and Nurse Practitioners) who all work together to provide you with the care you need, when you need it.  We recommend signing up for the patient portal called "MyChart".  Sign up information is provided on this After Visit Summary.  MyChart is used to connect with patients for Virtual Visits (Telemedicine).  Patients are able to view lab/test results, encounter notes, upcoming appointments, etc.  Non-urgent messages can be sent to your provider as well.   To learn more about what you can do with MyChart, go to [email or web address].

## 2023-10-24 MED ORDER — ROSUVASTATIN CALCIUM 20 MG PO TABS: 20.0000 mg | ORAL_TABLET | Freq: Every day | ORAL | 3 refills | Status: AC

## 2023-10-25 ENCOUNTER — Encounter: Payer: Self-pay | Admitting: Internal Medicine

## 2023-11-24 NOTE — Progress Notes (Deleted)
 [facility] [address] [place] [address] Phone: [phone] Subjective:    I'm seeing this patient by the request  of:  Maxie Better, MD  CC:   [id number]  Sharon Mccall is a 58 y.o. female coming in with complaint of tingling of B upper arms when sleeping. Patient states       Past Medical History:  Diagnosis Date   Body mass index (bmi) 26.0-26.9, adult    Cervical high risk HPV (human papillomavirus) test positive    Depression    Elevated fasting lipid profile    GERD (gastroesophageal reflux disease)    Hot flashes    Hx of colonoscopy    Migraines    PMB (postmenopausal bleeding)    PMS (premenstrual syndrome)    Sea sickness    Uterine fibroid    Vitamin D deficiency    Past Surgical History:  Procedure Laterality Date   DILATION AND EVACUATION     RIGHT/LEFT HEART CATH AND CORONARY ANGIOGRAPHY N/A 02/22/2017   Procedure: Right/Left Heart Cath and Coronary Angiography;  Surgeon: Kathleene Hazel, MD;  Location: [facility];  Service: Cardiovascular;  Laterality: N/A;   TUBAL LIGATION     UTERINE ARTERY EMBOLIZATION     Social History   Socioeconomic History   Marital status: Divorced    Spouse name: Not on file   Number of children: 2   Years of education: Not on file   Highest education level: Not on file  Occupational History   Not on file  Tobacco Use   Smoking status: Former    Current packs/day: 0.00    Types: Cigarettes    Quit date: 01/12/1994    Years since quitting: 29.8   Smokeless tobacco: Never  Vaping Use   Vaping status: Never Used  Substance and Sexual Activity   Alcohol use: No   Drug use: No   Sexual activity: Not on file  Other Topics Concern   Not on file  Social History Narrative   Not on file   Social Drivers of Health   Financial Resource Strain: Not on file  Food Insecurity: Not on file  Transportation Needs: Not on file  Physical Activity: Not  on file  Stress: Not on file  Social Connections: Not on file   Allergies  Allergen Reactions   Other Other (See Comments)   Phenergan [Promethazine Hcl] Other (See Comments)    shake   Wellbutrin [Bupropion] Hives   Family History  Problem Relation Age of Onset   Colon polyps Father    Diabetes Mellitus I Father    Lung cancer Maternal Grandmother    Hypertension Paternal Grandmother    Prostate cancer Paternal Grandfather    Colon cancer Neg Hx    Ovarian cancer Neg Hx    Breast cancer Neg Hx    Thyroid disease Neg Hx      Current Outpatient Medications (Cardiovascular):    carvedilol (COREG) 3.125 MG tablet, Take 1 tablet by mouth twice daily   rosuvastatin (CRESTOR) 20 MG tablet, Take 1 tablet (20 mg total) by mouth daily.   sacubitril-valsartan (ENTRESTO) 49-51 MG, Take 1 tablet by mouth 2 (two) times daily.   spironolactone (ALDACTONE) 25 MG tablet, Take 0.5 tablets (12.5 mg total) by mouth daily.   Current Outpatient Medications (Analgesics):    Aspirin-Acetaminophen-Caffeine (EXCEDRIN MIGRAINE PO), Take 1 tablet by mouth daily as needed (headache).    ibuprofen (ADVIL) 400 MG tablet,  Take 1 tablet (400 mg total) by mouth every 6 (six) hours as needed.   Current Outpatient Medications (Other):    Cholecalciferol (VITAMIN D3) 125 MCG (5000 UT) TABS, Take 1 tablet (5,000 Units total) by mouth daily in the afternoon.   esomeprazole (NEXIUM) 20 MG capsule, Take 20 mg by mouth daily. As needed   Reviewed prior external information including notes and imaging from  primary care provider As well as notes that were available from care everywhere and other healthcare systems.  Past medical history, social, surgical and family history all reviewed in electronic medical record.  No pertanent information unless stated regarding to the chief complaint.   Review of Systems:  No headache, visual changes, nausea, vomiting, diarrhea, constipation, dizziness, abdominal pain,  skin rash, fevers, chills, night sweats, weight loss, swollen lymph nodes, body aches, joint swelling, chest pain, shortness of breath, mood changes. POSITIVE muscle aches  Objective  Last menstrual period 04/14/2011.   General: No apparent distress alert and oriented x3 mood and affect normal, dressed appropriately.  HEENT: Pupils equal, extraocular movements intact  Respiratory: Patient's speak in full sentences and does not appear short of breath  Cardiovascular: No lower extremity edema, non tender, no erythema      Impression and Recommendations:

## 2023-11-27 ENCOUNTER — Ambulatory Visit: Payer: 59 | Admitting: Family Medicine

## 2023-12-31 ENCOUNTER — Other Ambulatory Visit: Payer: Self-pay | Admitting: Internal Medicine

## 2024-01-04 ENCOUNTER — Telehealth: Payer: Self-pay | Admitting: Internal Medicine

## 2024-01-04 NOTE — Telephone Encounter (Signed)
 Pt c/o medication issue:  1. Name of Medication: sacubitril -valsartan  (ENTRESTO ) 49-51 MG   2. How are you currently taking this medication (dosage and times per day)? 1 tablet, 2 times daily   3. Are you having a reaction (difficulty breathing--STAT)? No   4. What is your medication issue? Pt needs a new copay card, her's has expired

## 2024-01-04 NOTE — Telephone Encounter (Signed)
 Walked pt thru the website and she was able to complete the assistance profile and print off the co-pay card.  She was appreciative of the call back and assistance.

## 2024-01-10 ENCOUNTER — Encounter: Payer: Self-pay | Admitting: Pharmacy Technician

## 2024-01-10 ENCOUNTER — Other Ambulatory Visit (HOSPITAL_COMMUNITY): Payer: Self-pay

## 2024-01-10 ENCOUNTER — Telehealth: Payer: Self-pay | Admitting: Pharmacy Technician

## 2024-01-10 NOTE — Telephone Encounter (Signed)
 Pharmacy Patient Advocate Encounter  Received notification from [facility] that Prior Authorization for entresto  has been APPROVED from 01/10/24 to 01/09/25. Spoke to pharmacy to process.Copay is $10.00.    PA #/Case ID/Reference #: [id number]

## 2024-01-10 NOTE — Telephone Encounter (Signed)
 Pharmacy Patient Advocate Encounter   Received notification from CoverMyMeds that prior authorization for entresto  is required/requested.   Insurance verification completed.   The patient is insured through [facility] .   Per test claim: PA required; PA submitted to above mentioned insurance via CoverMyMeds Key/confirmation #/EOC [id number] Status is pending

## 2024-01-10 NOTE — Telephone Encounter (Signed)
 Follow Up:      Patient says she does not get her Entresto  through [facility]. She says she usually gets it from [facility]

## 2024-01-10 NOTE — Telephone Encounter (Signed)
 [facility] is the insurance. Rx is at [facility] waiting on pa. I called pt and made her aware

## 2024-02-15 ENCOUNTER — Other Ambulatory Visit: Payer: Self-pay | Admitting: Obstetrics and Gynecology

## 2024-02-15 DIAGNOSIS — Z1231 Encounter for screening mammogram for malignant neoplasm of breast: Secondary | ICD-10-CM

## 2024-02-19 ENCOUNTER — Ambulatory Visit

## 2024-03-05 ENCOUNTER — Ambulatory Visit

## 2024-03-11 ENCOUNTER — Ambulatory Visit
Admission: RE | Admit: 2024-03-11 | Discharge: 2024-03-11 | Disposition: A | Discharge status: Home / Self Care | Source: Ambulatory Visit | Attending: Obstetrics and Gynecology | Admitting: Obstetrics and Gynecology

## 2024-03-11 DIAGNOSIS — Z1231 Encounter for screening mammogram for malignant neoplasm of breast: Secondary | ICD-10-CM

## 2024-05-08 ENCOUNTER — Other Ambulatory Visit: Payer: Self-pay | Admitting: Internal Medicine

## 2024-09-03 ENCOUNTER — Other Ambulatory Visit: Payer: Self-pay

## 2024-09-03 ENCOUNTER — Encounter: Payer: Self-pay | Admitting: Emergency Medicine

## 2024-09-03 ENCOUNTER — Ambulatory Visit
Admission: EM | Admit: 2024-09-03 | Discharge: 2024-09-03 | Disposition: A | Discharge status: Home / Self Care | Attending: Nurse Practitioner | Admitting: Nurse Practitioner

## 2024-09-03 DIAGNOSIS — J101 Influenza due to other identified influenza virus with other respiratory manifestations: Secondary | ICD-10-CM | POA: Diagnosis not present

## 2024-09-03 DIAGNOSIS — R051 Acute cough: Secondary | ICD-10-CM

## 2024-09-03 LAB — POCT INFLUENZA A/B
Influenza A, POC: POSITIVE — AB
Influenza B, POC: NEGATIVE

## 2024-09-03 LAB — POC SOFIA SARS ANTIGEN FIA: SARS Coronavirus 2 Ag: NEGATIVE

## 2024-09-03 MED ORDER — BENZONATATE 200 MG PO CAPS: 200.0000 mg | ORAL_CAPSULE | Freq: Three times a day (TID) | ORAL | 0 refills | Status: AC | PRN

## 2024-09-03 MED ORDER — ACETAMINOPHEN 325 MG PO TABS
650.0000 mg | ORAL_TABLET | Freq: Once | ORAL | Status: AC
  Administered 2024-09-03: 650 mg via ORAL

## 2024-09-03 NOTE — ED Triage Notes (Signed)
 Pt here for fever and cough x 6 days; pt sts congestion and body aches; taking otc meds

## 2024-09-03 NOTE — ED Provider Notes (Signed)
 " [facility]    CSN: [id number] Arrival date & time: 09/03/24  1643      History   Chief Complaint Chief Complaint  Patient presents with   Fever    HPI Sharon Mccall is a 58 y.o. female.   Patient presents for 6-day history of of headache, runny nose, sore throat, cough, body aches, chills, fever (measured up to 101), and fatigue.  Has utilized Tylenol  and TheraFlu for her symptoms.  No reported chest pain, shortness of breath, abdominal pain, vomiting, or diarrhea.  She did not have her flu vaccine this year.  The history is provided by the patient.  Fever Associated symptoms: chills, congestion, cough, headaches, myalgias, rhinorrhea and sore throat   Associated symptoms: no chest pain, no diarrhea, no dysuria, no rash and no vomiting     Past Medical History:  Diagnosis Date   Body mass index (bmi) 26.0-26.9, adult    Cervical high risk HPV (human papillomavirus) test positive    Depression    Elevated fasting lipid profile    GERD (gastroesophageal reflux disease)    Hot flashes    Hx of colonoscopy    Migraines    PMB (postmenopausal bleeding)    PMS (premenstrual syndrome)    Sea sickness    Uterine fibroid    Vitamin D  deficiency     Patient Active Problem List   Diagnosis Date Noted   Dyslipidemia 01/16/2019   Chronic combined systolic and diastolic heart failure (HCC) 04/10/2018   Non-ischemic cardiomyopathy (HCC)     Past Surgical History:  Procedure Laterality Date   DILATION AND EVACUATION     RIGHT/LEFT HEART CATH AND CORONARY ANGIOGRAPHY N/A 02/22/2017   Procedure: Right/Left Heart Cath and Coronary Angiography;  Surgeon: Verlin Lonni BIRCH, MD;  Location: [facility];  Service: Cardiovascular;  Laterality: N/A;   TUBAL LIGATION     UTERINE ARTERY EMBOLIZATION      OB History   No obstetric history on file.      Home Medications    Prior to Admission medications  Medication Sig Start Date End Date  Taking? Authorizing Provider  benzonatate  (TESSALON ) 200 MG capsule Take 1 capsule (200 mg total) by mouth 3 (three) times daily as needed for cough. 09/03/24  Yes Janet Therisa PARAS, FNP  Aspirin -Acetaminophen -Caffeine (EXCEDRIN MIGRAINE PO) Take 1 tablet by mouth daily as needed (headache).     [provider]  carvedilol  (COREG ) 3.125 MG tablet Take 1 tablet by mouth twice daily with food 05/08/24   Okey Vina GAILS, MD  Cholecalciferol (VITAMIN D3) 125 MCG (5000 UT) TABS Take 1 tablet (5,000 Units total) by mouth daily in the afternoon. 10/04/23   Okey Vina GAILS, MD  esomeprazole (NEXIUM) 20 MG capsule Take 20 mg by mouth daily. As needed    [provider]  ibuprofen  (ADVIL ) 400 MG tablet Take 1 tablet (400 mg total) by mouth every 6 (six) hours as needed. 10/15/21   Palumbo, April, MD  rosuvastatin  (CRESTOR ) 20 MG tablet Take 1 tablet (20 mg total) by mouth daily. 10/24/23   Okey Vina GAILS, MD  sacubitril -valsartan  (ENTRESTO ) 49-51 MG Take 1 tablet by mouth twice daily 01/03/24   Ross, Paula V, MD  spironolactone  (ALDACTONE ) 25 MG tablet Take 0.5 tablets (12.5 mg total) by mouth daily. 10/04/23   Okey Vina GAILS, MD    Family History Family History  Problem Relation Age of Onset   Colon polyps Father    Diabetes  Mellitus I Father    Lung cancer Maternal Grandmother    Hypertension Paternal Grandmother    Prostate cancer Paternal Grandfather    Colon cancer Neg Hx    Ovarian cancer Neg Hx    Breast cancer Neg Hx    Thyroid disease Neg Hx     Social History Social History[1]   Allergies   Other, Phenergan [promethazine hcl], and Wellbutrin [bupropion]   Review of Systems Review of Systems  Constitutional:  Positive for chills, fatigue and fever.  HENT:  Positive for congestion, rhinorrhea and sore throat.   Eyes:  Negative for pain and redness.  Respiratory:  Positive for cough. Negative for shortness of breath.   Cardiovascular:  Negative for chest pain.   Gastrointestinal:  Negative for diarrhea and vomiting.  Genitourinary:  Negative for dysuria.  Musculoskeletal:  Positive for myalgias. Negative for arthralgias.  Skin:  Negative for rash.  Neurological:  Positive for headaches. Negative for dizziness.  Psychiatric/Behavioral:  Negative for sleep disturbance.    Physical Exam Triage Vital Signs ED Triage Vitals  Encounter Vitals Group     BP 09/03/24 1839 (!) 145/89     Girls Systolic BP Percentile --      Girls Diastolic BP Percentile --      Boys Systolic BP Percentile --      Boys Diastolic BP Percentile --      Pulse Rate 09/03/24 1839 93     Resp 09/03/24 1839 18     Temp 09/03/24 1839 (!) 102.9 F (39.4 C)     Temp Source 09/03/24 1839 Oral     SpO2 09/03/24 1839 95 %     Weight --      Height --      Head Circumference --      Peak Flow --      Pain Score 09/03/24 1840 5     Pain Loc --      Pain Education --      Exclude from Growth Chart --    No data found.  Updated Vital Signs BP (!) 145/89 (BP Location: Left Arm)   Pulse 93   Temp (!) 102.9 F (39.4 C) (Oral)   Resp 18   LMP 04/14/2011   SpO2 95%    Physical Exam Vitals and nursing note reviewed.  Constitutional:      Appearance: Normal appearance.     Comments: Acutely febrile (given Tylenol  in clinic), appears to feel unwell  HENT:     Head: Normocephalic.     Right Ear: Tympanic membrane and ear canal normal.     Left Ear: Tympanic membrane and ear canal normal.     Nose: Congestion present.     Mouth/Throat:     Mouth: Mucous membranes are moist.     Pharynx: Posterior oropharyngeal erythema present.  Eyes:     Extraocular Movements: Extraocular movements intact.     Conjunctiva/sclera: Conjunctivae normal.     Pupils: Pupils are equal, round, and reactive to light.  Cardiovascular:     Rate and Rhythm: Normal rate and regular rhythm.     Heart sounds: Normal heart sounds.  Pulmonary:     Effort: Pulmonary effort is normal.     Breath  sounds: Normal breath sounds. No wheezing, rhonchi or rales.  Abdominal:     General: Bowel sounds are normal.  Skin:    General: Skin is warm and dry.  Neurological:     General: No focal deficit present.  Mental Status: She is alert and oriented to [place], place, and time.  Psychiatric:        Mood and Affect: Mood normal.        Behavior: Behavior normal.        Thought Content: Thought content normal.        Judgment: Judgment normal.    UC Treatments / Results  Labs (all labs ordered are listed, but only abnormal results are displayed) Labs Reviewed  POCT INFLUENZA A/B - Abnormal; Notable for the following components:      Result Value   Influenza A, POC Positive (*)    All other components within normal limits  POC SOFIA SARS ANTIGEN FIA    EKG   Radiology No results found.  Procedures Procedures (including critical care time)  Medications Ordered in UC Medications  acetaminophen  (TYLENOL ) tablet 650 mg (650 mg Oral Given 09/03/24 1854)    Initial Impression / Assessment and Plan / UC Course  I have reviewed the triage vital signs and the nursing notes.  Pertinent labs & imaging results that were available during my care of the patient were reviewed by me and considered in my medical decision making (see chart for details).    Patient presented for evaluation of URI symptoms and a cough that has been ongoing for approximately 6 days.  She is acutely febrile in clinic and appears to feel unwell.  Positive influenza A testing.  Unfortunately, she is outside the recommended window of treatment with antivirals.  I discussed use of ibuprofen , DayQuil, NyQuil, and prescribed Tessalon  Perles to help promote rest and alleviate the cough.  Encouraged adequate oral hydration and rest.  Work note was provided if needed.  Red flags reviewed which would warrant return evaluation to include continued fever, chest pain, shortness of breath, worsening cough, or vomiting with  concerns for dehydration.  Final Clinical Impressions(s) / UC Diagnoses   Final diagnoses:  Influenza A  Acute cough     Discharge Instructions      You have Influenza A - unfortunately, you are outside the recommended window of time for treatment with anti-viral therapy.   I have prescribed a cough capsule to help promote rest and symptom management May use Dayquil/Nyquil as needed, Tylenol /Motrin  for body aches/fever Ensure adequate rest and oral hydration Work note provided if needed Return for continued fever, chest pain, shortness of breath, abdominal pain, vomiting, or diarrhea.    ED Prescriptions     Medication Sig Dispense Auth. Provider   benzonatate  (TESSALON ) 200 MG capsule Take 1 capsule (200 mg total) by mouth 3 (three) times daily as needed for cough. 20 capsule Janet Therisa PARAS, FNP      PDMP not reviewed this encounter.    [1]  Social History Tobacco Use   Smoking status: Former    Current packs/day: 0.00    Types: Cigarettes    Quit date: 01/12/1994    Years since quitting: 30.6   Smokeless tobacco: Never  Vaping Use   Vaping status: Never Used  Substance Use Topics   Alcohol use: No   Drug use: No     Janet Therisa PARAS, FNP 09/03/24 1908  "

## 2024-09-03 NOTE — Discharge Instructions (Addendum)
 You have Influenza A - unfortunately, you are outside the recommended window of time for treatment with anti-viral therapy.   I have prescribed a cough capsule to help promote rest and symptom management May use Dayquil/Nyquil as needed, Tylenol /Motrin  for body aches/fever Ensure adequate rest and oral hydration Work note provided if needed Return for continued fever, chest pain, shortness of breath, abdominal pain, vomiting, or diarrhea.

## 2024-09-23 ENCOUNTER — Telehealth: Payer: Self-pay | Admitting: Emergency Medicine

## 2024-09-23 DIAGNOSIS — I5022 Chronic systolic (congestive) heart failure: Secondary | ICD-10-CM

## 2024-09-23 NOTE — Telephone Encounter (Signed)
 Spoke with [facility]. Pt needing labs, but seems they were released last January and lab unable to view them (after 6 months per lab). Lab orders re-entered and will make Dr Okey aware.

## 2024-09-24 LAB — HEPATIC FUNCTION PANEL
ALT: 9 IU/L (ref 0–32)
AST: 14 IU/L (ref 0–40)
Albumin: 4.4 g/dL (ref 3.8–4.9)
Alkaline Phosphatase: 112 IU/L (ref 49–135)
Bilirubin Total: 0.5 mg/dL (ref 0.0–1.2)
Bilirubin, Direct: 0.16 mg/dL (ref 0.00–0.40)
Total Protein: 7.6 g/dL (ref 6.0–8.5)

## 2024-09-24 LAB — BASIC METABOLIC PANEL WITH GFR
BUN/Creatinine Ratio: 11 (ref 9–23)
BUN: 9 mg/dL (ref 6–24)
CO2: 21 mmol/L (ref 20–29)
Calcium: 9.6 mg/dL (ref 8.7–10.2)
Chloride: 104 mmol/L (ref 96–106)
Creatinine, Ser: 0.82 mg/dL (ref 0.57–1.00)
Glucose: 97 mg/dL (ref 70–99)
Potassium: 4.4 mmol/L (ref 3.5–5.2)
Sodium: 140 mmol/L (ref 134–144)
eGFR: 83 mL/min/1.73

## 2024-09-24 LAB — NMR, LIPOPROFILE
Cholesterol, Total: 159 mg/dL (ref 100–199)
HDL Particle Number: 31.9 umol/L
HDL-C: 42 mg/dL
LDL Particle Number: 1031 nmol/L — AB
LDL Size: 20 nm — AB
LDL-C (NIH Calc): 89 mg/dL (ref 0–99)
LP-IR Score: 77 — AB
Small LDL Particle Number: 599 nmol/L — AB
Triglycerides: 163 mg/dL — AB (ref 0–149)

## 2024-09-24 LAB — HEMOGLOBIN A1C
Est. average glucose Bld gHb Est-mCnc: 134 mg/dL
Hgb A1c MFr Bld: 6.3 % — ABNORMAL HIGH (ref 4.8–5.6)

## 2024-09-25 ENCOUNTER — Ambulatory Visit: Payer: Self-pay | Admitting: Internal Medicine

## 2024-09-25 DIAGNOSIS — G308 Other Alzheimer's disease: Secondary | ICD-10-CM

## 2024-09-25 DIAGNOSIS — Z79899 Other long term (current) drug therapy: Secondary | ICD-10-CM

## 2024-09-25 DIAGNOSIS — E785 Hyperlipidemia, unspecified: Secondary | ICD-10-CM

## 2024-10-01 ENCOUNTER — Other Ambulatory Visit: Payer: Self-pay | Admitting: Internal Medicine

## 2024-10-04 ENCOUNTER — Other Ambulatory Visit: Payer: Self-pay | Admitting: Internal Medicine

## 2024-10-21 ENCOUNTER — Ambulatory Visit: Admitting: Emergency Medicine
